# Patient Record
Sex: Female | Born: 1945 | Race: White | Hispanic: No | Marital: Married
Health system: Southern US, Academic
[De-identification: ages and names within clinical notes are randomized; demographics above are authoritative.]

## PROBLEM LIST (undated history)

## (undated) DIAGNOSIS — E039 Hypothyroidism, unspecified: Secondary | ICD-10-CM

## (undated) DIAGNOSIS — I351 Nonrheumatic aortic (valve) insufficiency: Secondary | ICD-10-CM

## (undated) DIAGNOSIS — E785 Hyperlipidemia, unspecified: Secondary | ICD-10-CM

## (undated) HISTORY — DX: Hypothyroidism, unspecified: E03.9

## (undated) HISTORY — DX: Hyperlipidemia, unspecified: E78.5

## (undated) HISTORY — DX: Nonrheumatic aortic (valve) insufficiency: I35.1

## (undated) HISTORY — PX: OTHER SURGICAL HISTORY: SHX169

---

## 2006-10-21 ENCOUNTER — Encounter: Admission: RE | Admit: 2006-10-21 | Discharge: 2006-10-21 | Payer: Self-pay | Admitting: Family Medicine

## 2007-03-29 ENCOUNTER — Ambulatory Visit: Payer: Self-pay | Admitting: Cardiology

## 2007-05-11 ENCOUNTER — Ambulatory Visit: Payer: Self-pay

## 2007-05-11 ENCOUNTER — Encounter: Payer: Self-pay | Admitting: Cardiology

## 2007-05-19 ENCOUNTER — Ambulatory Visit: Payer: Self-pay | Admitting: Cardiology

## 2007-05-19 ENCOUNTER — Encounter: Admission: RE | Admit: 2007-05-19 | Discharge: 2007-05-19 | Payer: Self-pay | Admitting: Neurosurgery

## 2007-12-22 ENCOUNTER — Encounter: Admission: RE | Admit: 2007-12-22 | Discharge: 2007-12-22 | Payer: Self-pay | Admitting: Neurosurgery

## 2008-01-04 ENCOUNTER — Encounter: Admission: RE | Admit: 2008-01-04 | Discharge: 2008-01-04 | Payer: Self-pay | Admitting: Neurosurgery

## 2008-05-03 ENCOUNTER — Encounter: Payer: Self-pay | Admitting: Cardiology

## 2008-05-03 ENCOUNTER — Ambulatory Visit: Payer: Self-pay

## 2008-06-28 ENCOUNTER — Ambulatory Visit: Payer: Self-pay | Admitting: Cardiology

## 2009-08-18 ENCOUNTER — Encounter: Payer: Self-pay | Admitting: Cardiology

## 2010-01-06 ENCOUNTER — Ambulatory Visit: Payer: Self-pay | Admitting: Cardiology

## 2010-01-06 DIAGNOSIS — I359 Nonrheumatic aortic valve disorder, unspecified: Secondary | ICD-10-CM | POA: Insufficient documentation

## 2010-01-06 DIAGNOSIS — I10 Essential (primary) hypertension: Secondary | ICD-10-CM | POA: Insufficient documentation

## 2010-02-04 ENCOUNTER — Ambulatory Visit: Payer: Self-pay

## 2010-02-04 ENCOUNTER — Ambulatory Visit: Payer: Self-pay | Admitting: Cardiovascular Disease

## 2010-02-04 ENCOUNTER — Ambulatory Visit (HOSPITAL_COMMUNITY): Admission: RE | Admit: 2010-02-04 | Discharge: 2010-02-04 | Payer: Self-pay | Admitting: Cardiology

## 2010-02-04 ENCOUNTER — Encounter: Payer: Self-pay | Admitting: Cardiology

## 2010-02-25 ENCOUNTER — Encounter: Payer: Self-pay | Admitting: Cardiology

## 2010-03-10 ENCOUNTER — Ambulatory Visit: Payer: Self-pay | Admitting: Cardiology

## 2010-03-10 DIAGNOSIS — I080 Rheumatic disorders of both mitral and aortic valves: Secondary | ICD-10-CM | POA: Insufficient documentation

## 2010-05-17 ENCOUNTER — Encounter: Payer: Self-pay | Admitting: Neurosurgery

## 2010-05-28 NOTE — Assessment & Plan Note (Signed)
Summary: 2 month rov.sl   Visit Type:  Follow-up Primary Provider:  Hamrick  CC:  No complaints.  History of Present Illness: Patient seen in follow up.  Doing fairly well.  Echo results reviewed in detail.  She is asymptomatic, but uses a cane.  On disability related to a work related injury. Has disc disease.   Denies chest pain, or significant shortness of breath.  BP has been well controlled.  Prefers to defer echo studies for as long as possible    Current Medications (verified): 1)  Metoprolol Tartrate 50 Mg Tabs (Metoprolol Tartrate) .... Take One Tablet Two Times A Day 2)  Levothyroxine Sodium 50 Mcg Tabs (Levothyroxine Sodium) .... Take 1 Tablet By Mouth Once A Day 3)  Flexeril 10 Mg Tabs (Cyclobenzaprine Hcl) .... Take 1 Tablet By Mouth Once A Day 4)  Oxycodone-Acetaminophen 5-325 Mg Tabs (Oxycodone-Acetaminophen) .... As Needed 5)  Fish Oil 1000 Mg Caps (Omega-3 Fatty Acids) .... Take 1 Capsule By Mouth Two Times A Day 6)  Cholest Off 450 Mg Tabs (Plant Sterols and Stanols) .... Take 1 Tablet By Mouth Once A Day 7)  Natural Vegetable Laxative  Tabs (Senna-Fennel) .... Take 1 Tablet By Mouth Two Times A Day 8)  Rhinocort Aqua 32 Mcg/act Susp (Budesonide) .... As Needed 9)  Amitriptyline Hcl 50 Mg Tabs (Amitriptyline Hcl) .... Take 1 Tab By Mouth At Bedtime 10)  Promethazine Hcl 25 Mg Tabs (Promethazine Hcl) .... For Nausea  Allergies: 1)  ! Penicillin 2)  ! Talwin  Vital Signs:  Patient profile:   65 year old female Height:      65 inches Weight:      133 pounds BMI:     22.21 Pulse rate:   60 / minute Pulse rhythm:   regular Resp:     18 per minute BP sitting:   110 / 60  (right arm) Cuff size:   regular  Vitals Entered By: Vikki Ports (March 10, 2010 9:18 AM)  Physical Exam  General:  Well developed, well nourished, in no acute distress. Head:  normocephalic and atraumatic Eyes:  PERRLA/EOM intact; conjunctiva and lids normal. Lungs:  Clear bilaterally  to auscultation and percussion. Heart:  Normal S1 and S2.  Ocassional extrasystoles.  Apical murmur 2/6, early diastolic blow 1-2/6 Pulses:  pulses normal in all 4 extremities Extremities:  No clubbing or cyanosis. No edema Neurologic:  Alert and oriented x 3.   Echocardiogram  Procedure date:  02/04/2010  Findings:      Study Conclusions            - Left ventricle: The cavity size was mildly dilated. The estimated       ejection fraction was 55%.     - Aortic valve: Moderate regurgitation.     - Aorta: The aorta was moderately dilated.     - Atrial septum: No defect or patent foramen ovale was identified.  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  Her Lv function is preserved, and she has mild/mod AI and MR.  Echo reviewed per report of Dr. Eden Emms.  I suggested to her that we should follow her annually, but she wants to push that out farther.  We discussed the difficulty with more than one lesion of predictability of long term outcome.  Cannot do exercise test.  BP is well controlled, and she has cut back on medication.  Aortic root dimensions noted and will keep in mind, and watch over long term.    Problem # 2:  HYPERTENSION, BENIGN (ICD-401.1) currently under control at present time. Her updated medication list for this problem includes:    Metoprolol Tartrate 50 Mg Tabs (Metoprolol tartrate) .Marland Kitchen... Take one tablet two times a day  Patient Instructions: 1)  Your physician recommends that you schedule a follow-up appointment in: 18 months

## 2010-05-28 NOTE — Assessment & Plan Note (Signed)
Summary: 18 month ROV   Visit Type:  Follow-up  CC:  No complains.  History of Present Illness: Patient has lost alot of weight.  She feels better, and looks great.  Not walking enough to tell if she would get short of breath.  Denies any chest pain.    Current Medications (verified): 1)  Metoprolol Tartrate 50 Mg Tabs (Metoprolol Tartrate) .... Take 1 1/2  Tablet By Mouth Twice A Day 2)  Levothyroxine Sodium 50 Mcg Tabs (Levothyroxine Sodium) .... Take 1 Tablet By Mouth Once A Day 3)  Flexeril 10 Mg Tabs (Cyclobenzaprine Hcl) .... Take 1 Tablet By Mouth Once A Day 4)  Oxycodone-Acetaminophen 5-325 Mg Tabs (Oxycodone-Acetaminophen) .... As Needed 5)  Fish Oil 1000 Mg Caps (Omega-3 Fatty Acids) .... Take 1 Capsule By Mouth Two Times A Day 6)  Cholest Off 450 Mg Tabs (Plant Sterols and Stanols) .... Take 1 Tablet By Mouth Once A Day 7)  Natural Vegetable Laxative  Tabs (Senna-Fennel) .... Take 1 Tablet By Mouth Two Times A Day 8)  Rhinocort Aqua 32 Mcg/act Susp (Budesonide) .... As Needed 9)  Amitriptyline Hcl 50 Mg Tabs (Amitriptyline Hcl) .... Take 1 Tab By Mouth At Bedtime 10)  Promethazine Hcl 25 Mg Tabs (Promethazine Hcl) .... For Nausea  Allergies (verified): 1)  ! Penicillin 2)  ! Talwin  Past History:  Past Medical History: Last updated: 06-25-08 Aortic Insufficiency Hyperlipidemia Hypothyroidism  Past Surgical History: Last updated: 25-Jun-2008 Choecsytectomy Hysterectomy Ruptured Cervical Disk  Family History: Last updated: June 25, 2008 Mother   died age 32 Cancer Father   d6ied age 78 fire/stroke Sisters 8    6 alive 2 deceased bypass/stents  Social History: Last updated: 06-25-08 Disabled due to back 40 pack year history quit 1990  Vital Signs:  Patient profile:   65 year old female Height:      65 inches Weight:      131.50 pounds BMI:     21.96 Pulse rate:   80 / minute Pulse rhythm:   irregular Resp:     18 per minute BP sitting:   126 / 70   (left arm) Cuff size:   regular  Vitals Entered By: Vikki Ports (January 06, 2010 1:08 PM)  Physical Exam  General:  Well developed, well nourished, in no acute distress. Head:  normocephalic and atraumatic Eyes:  PERRLA/EOM intact; conjunctiva and lids normal. Lungs:  Clear bilaterally to auscultation and percussion. Heart:  PMI non displaced.  Normal S1 and S2.  Early diastolic blow 2/6.  No rumble. Abdomen:  Bowel sounds positive; abdomen soft and non-tender without masses, organomegaly, or hernias noted. No hepatosplenomegaly. Pulses:  pulses normal in all 4 extremities Extremities:  No clubbing or cyanosis. Neurologic:  Alert and oriented x 3.   EKG  Procedure date:  01/06/2010  Findings:      NSR.  Occasional PVBs.  Nonspecific ST abnormality.   Impression & Recommendations:  Problem # 1:  AORTIC STENOSIS/ INSUFFICIENCY, NON-RHEUMATIC (ICD-424.1) Has AI at present time.  Mild/moderate by exam.  Needs echo and CT scan to assess aorta.  Concerned about costs of these studies.   Her updated medication list for this problem includes:    Metoprolol Tartrate 50 Mg Tabs (Metoprolol tartrate) .Marland Kitchen... Take 1 1/2  tablet by mouth twice a day  Problem # 2:  HYPERTENSION, BENIGN (ICD-401.1) Controlled on current medications.  Her updated medication list for this problem includes:    Metoprolol Tartrate 50 Mg Tabs (Metoprolol tartrate) .Marland KitchenMarland KitchenMarland KitchenMarland Kitchen  Take 1 1/2  tablet by mouth twice a day  Orders: EKG w/ Interpretation (93000)  Other Orders: Echocardiogram (Echo)  Patient Instructions: 1)  Your physician recommends that you schedule a follow-up appointment in: 2 MONTHS 2)  Your physician recommends that you continue on your current medications as directed. Please refer to the Current Medication list given to you today. 3)  Your physician has requested that you have an echocardiogram.  Echocardiography is a painless test that uses sound waves to create images of your heart. It  provides your doctor with information about the size and shape of your heart and how well your heart's chambers and valves are working.  This procedure takes approximately one hour. There are no restrictions for this procedure.

## 2010-09-08 NOTE — Assessment & Plan Note (Signed)
[facility]                            CARDIOLOGY OFFICE NOTE   Theresa Weber, Theresa Weber                     MRN:          [id number]  DATE:06/28/2008                            DOB:          January 04, 1946    Theresa Weber is in for followup visit.  To briefly summarize, she has been  relatively stable from a cardiac standpoint.  She had a problem with  removal of a glass from her left foot.  This is now healing, but it is  required quite a bit of treatment.  She also has a bad toe on the right,  and this was required some therapy as well.  The patient is a nonsmoker.  She has hypothyroidism and had laboratory studies done in November.  She  also has an elevated white count, this has been followed by Dr. Nathanial Rancher.  She had an MRI of her back, and she has had steroid injections  apparently.  She is currently on metoprolol on b.i.d. basis.   MEDICATIONS:  1. Levothyroxine 50 mcg daily.  2. Fish oil daily.  3. Cholesterol vegetable laxative b.i.d.  4. Flexeril 10 mg two daily.  5. Metoprolol 50 mg one and half tablets b.i.d.   On physical, the blood pressure is 140/70 and the pulse is 57.  She does  not have obviously bounding pulses.  She does have a minimal systolic  ejection murmur and early diastolic blow compatible with moderate aortic  regurgitation.  There is no extremity edema.   The echocardiogram demonstrates overall normal left ventricular size and  function.  There is mild aortic root dilatation, but the actual aortic  size is measured at 36 mm.  There is moderate AR noted.   The electrocardiogram demonstrates normal sinus bradycardia, otherwise  unremarkable.   IMPRESSION:  1. Hypertension.  2. Moderate aortic regurgitation.  3. Mild/moderate aortic regurgitation.  4. Hypothyroidism.   RECOMMENDATIONS:  1. I had recommended the patient come back for annual followup with 2-      D study on an annual basis given her AI.  She would like to  defer      that to 18 months.  She probably will be stable      to do that, but I emphasized to her the need for continued      followup.  2. She will continue to follow up with Dr. Nathanial Rancher.     Arturo Morton. Riley Kill, MD, [facility]  Electronically Signed    TDS/MedQ  DD: 06/28/2008  DT: 06/29/2008  Job #: [id number]   cc:   Burnell Blanks, MD

## 2010-09-08 NOTE — Assessment & Plan Note (Signed)
[facility]                            CARDIOLOGY OFFICE NOTE   Theresa Weber, Theresa Weber                     MRN:          [id number]  DATE:05/19/2007                            DOB:          [birth year]    Theresa Weber is in for a follow-up visit.  In general, she has been  relatively stable.  She has not been having any current chest  discomfort. She does continue to have leg and some back discomfort.   CURRENT MEDICATIONS:  1. Metoprolol 50 mg b.i.d.  2. Levothyroxine 50 mcg daily.  3. Vegetable laxative.  4. Flexeril 2-3 daily.   RECENT LABORATORY STUDIES:  BUN of 24, creatinine 1.08.  White count was  11.9,  hemoglobin 12.8, TSH was normal at 2.53.   Most recent echocardiogram reveals normal left ventricular size and  function with an ejection fraction of 55%.  There is just mild aortic  regurgitation to moderate at most and renal artery imaging suggested  patent renal arteries bilaterally.   IMPRESSION:  1. Hypertension.  2. Mild aortic regurgitation.  3. Hypothyroidism.  4. No significant evidence of peripheral vascular disease.   PLAN:  Return to clinic in 6 months.     Arturo Morton. Riley Kill, MD, [facility]  Electronically Signed    TDS/MedQ  DD: 07/17/2007  DT: 07/17/2007  Job #: [id number]

## 2010-09-08 NOTE — Assessment & Plan Note (Signed)
[facility]ALTHC[facility]                            CARDIOLOGY OFFICE NOTE   IKHLAS, ALBO                     MRN:          [id number]  DATE:03/29/2007                            DOB:          Sep 03, 1945    Ms. Dearing is in for followup.  She is not really feeling very well.  A  number of things have occurred since I last saw her.  She was uninsured  and felt guilty about coming back.  In June, she had sudden pain in her  right leg.  She had an MRI and apparently was referred to Dr. Franky Macho  who looked at the MRI.  Surgery was considered.  The left leg hurts more  than the right.  She wondered about peripheral vascular disease and  asked about this today.  She does not smoke.  Apparently, because of  financial issues, she did not go ahead and have the surgery, although  her legs continue to hurt.   MEDICATIONS:  1. Metoprolol 50 mg b.i.d.  2. Levothyroxine 50 mcg daily.  3. Fish oil.  4. Cholesterol.  5. Vegetable laxative.   EXAMINATION:  Blood pressure 163/90, pulse 78.  There are no femoral bruits and she has intact pulses.  She has  excellent popliteal pulses and pulses in the feet as well.   Review of her recent laboratory studies reveal a creatinine 1.3, BUN 29.  Liver functions were normal.  Hematocrit was 12.5.  A TSH was mildly  elevated at 5.67.   Her EKG today reveals normal sinus rhythm and within normal limits.   We will need to review her data.  Her blood pressures are elevated  currently, and her BUN and creatinine are slightly increased.  The  etiology for the mild renal dysfunction is unclear.  She would benefit  from an abdominal ultrasound to identify the renal arteries.  This would  allow [place] to exclude renovascular hypertension for deterioration, and  also clear the way for perhaps being more aggressive with ACE  inhibition.  She also likely needs urinary studies to exclude  proteinuria.  The patient also has a history of  moderate aortic  regurgitation, although this is difficult to tell on examination today.  We will go ahead and repeat her 2D echocardiogram.     Arturo Morton. Riley Kill, MD, [facility]  Electronically Signed    TDS/MedQ  DD: 03/31/2007  DT: 03/31/2007  Job #: [id number]

## 2011-06-21 ENCOUNTER — Telehealth: Payer: Self-pay | Admitting: Cardiology

## 2011-06-21 DIAGNOSIS — I080 Rheumatic disorders of both mitral and aortic valves: Secondary | ICD-10-CM

## 2011-06-21 NOTE — Telephone Encounter (Signed)
New msg Pt said she thought she was supposed to have echo before her next appt please call her back

## 2011-06-21 NOTE — Telephone Encounter (Signed)
This pt should have an echo prior to appointment. Order for Echo placed in system.

## 2011-06-22 NOTE — Telephone Encounter (Signed)
Lela called the pt and scheduled her for an ECHO the same day.

## 2011-08-12 ENCOUNTER — Encounter: Payer: Self-pay | Admitting: *Deleted

## 2011-08-27 ENCOUNTER — Encounter: Payer: Self-pay | Admitting: Cardiology

## 2011-08-27 ENCOUNTER — Other Ambulatory Visit: Payer: Self-pay

## 2011-08-27 ENCOUNTER — Ambulatory Visit (INDEPENDENT_AMBULATORY_CARE_PROVIDER_SITE_OTHER): Payer: Medicare Other | Admitting: Cardiology

## 2011-08-27 ENCOUNTER — Ambulatory Visit: Payer: Self-pay | Admitting: Cardiology

## 2011-08-27 ENCOUNTER — Ambulatory Visit (HOSPITAL_COMMUNITY): Payer: Medicare Other | Attending: Internal Medicine

## 2011-08-27 VITALS — BP 122/74 | HR 58 | Ht 65.0 in | Wt 136.1 lb

## 2011-08-27 DIAGNOSIS — I359 Nonrheumatic aortic valve disorder, unspecified: Secondary | ICD-10-CM

## 2011-08-27 DIAGNOSIS — E785 Hyperlipidemia, unspecified: Secondary | ICD-10-CM | POA: Insufficient documentation

## 2011-08-27 DIAGNOSIS — I10 Essential (primary) hypertension: Secondary | ICD-10-CM

## 2011-08-27 DIAGNOSIS — I080 Rheumatic disorders of both mitral and aortic valves: Secondary | ICD-10-CM

## 2011-08-27 NOTE — Progress Notes (Signed)
   HPI:  Patient is in for follow up.  She is doing well.  She does not exercise well because of her feet.  She and I reviewed her echo images today.  Denies any chest pain.  No exertional symptoms.    Current Outpatient Prescriptions  Medication Sig Dispense Refill  . amitriptyline (ELAVIL) 50 MG tablet Take 75 mg by mouth at bedtime.       . budesonide (RHINOCORT AQUA) 32 MCG/ACT nasal spray Place 1 spray into the nose as needed.      . cyclobenzaprine (FLEXERIL) 10 MG tablet Take 10 mg by mouth daily.      . fish oil-omega-3 fatty acids 1000 MG capsule Take 2 g by mouth daily.      Marland Kitchen levothyroxine (SYNTHROID, LEVOTHROID) 50 MCG tablet Take 50 mcg by mouth daily.      Marland Kitchen METOPROLOL TARTRATE PO Take 50 mg by mouth 2 (two) times daily. One tablet in the am and 1/2 tablet in the pm      . Plant Sterols and Stanols (CHOLEST OFF) 450 MG TABS Take 450 mg by mouth daily.      . Senna (NATURAL VEGETABLE LAXATIVE PO) Take by mouth 3 (three) times daily.       . traMADol (ULTRAM) 50 MG tablet Take 50 mg by mouth daily.        Allergies  Allergen Reactions  . Penicillins   . Pentazocine Lactate     Past Medical History  Diagnosis Date  . Aortic insufficiency   . Hyperlipidemia   . Hypothyroidism     Past Surgical History  Procedure Date  . Choecsytectomy   . Hyterectomy   . Ruptured cervical disk     Family History  Problem Relation Age of Onset  . Cancer Mother 52  . Stroke Father 81    History   Social History  . Marital Status: Married    Spouse Name: N/A    Number of Children: N/A  . Years of Education: N/A   Occupational History  . disabled    Social History Main Topics  . Smoking status: Former Smoker    Quit date: 04/26/1988  . Smokeless tobacco: Not on file  . Alcohol Use: Not on file  . Drug Use: Not on file  . Sexually Active: Not on file   Other Topics Concern  . Not on file   Social History Narrative  . No narrative on file    ROS: Please see the  HPI.  All other systems reviewed and negative.  PHYSICAL EXAM:  BP 122/74  Pulse 58  Ht 5\' 5"  (1.651 m)  Wt 136 lb 1.9 oz (61.744 kg)  BMI 22.65 kg/m2  General: Well developed, well nourished, in no acute distress. Head:  Normocephalic and atraumatic. Neck: no JVD Lungs: Clear to auscultation and percussion. Heart: Normal S1 and S2. SEM 2/6. 1/6 early diastolic blow.   Abdomen:  Normal bowel sounds; soft; non tender; no organomegaly Pulses: Pulses normal in all 4 extremities. Extremities: No clubbing or cyanosis. No edema. Neurologic: Alert and oriented x 3.  EKG:  SB.  First degree av block.    ASSESSMENT AND PLAN:

## 2011-08-27 NOTE — Patient Instructions (Signed)
Your physician wants you to follow-up in: February/March 2014.  You will receive a reminder letter in the mail two months in advance. If you don't receive a letter, please call our office to schedule the follow-up appointment.  Your physician recommends that you continue on your current medications as directed. Please refer to the Current Medication list given to you today.  

## 2011-08-27 NOTE — Assessment & Plan Note (Signed)
Exam not much changed.   May need CT for aortic root assesment.  Continue close follow up.

## 2011-08-27 NOTE — Assessment & Plan Note (Signed)
Nicely controlled.   

## 2011-09-17 ENCOUNTER — Other Ambulatory Visit: Payer: Self-pay | Admitting: Family Medicine

## 2011-09-17 DIAGNOSIS — M25519 Pain in unspecified shoulder: Secondary | ICD-10-CM

## 2011-09-18 ENCOUNTER — Ambulatory Visit
Admission: RE | Admit: 2011-09-18 | Discharge: 2011-09-18 | Disposition: A | Discharge status: Home / Self Care | Payer: Medicare Other | Source: Ambulatory Visit | Attending: Family Medicine | Admitting: Family Medicine

## 2011-09-18 DIAGNOSIS — M25519 Pain in unspecified shoulder: Secondary | ICD-10-CM

## 2011-09-27 ENCOUNTER — Telehealth: Payer: Self-pay | Admitting: Cardiology

## 2011-09-27 DIAGNOSIS — I10 Essential (primary) hypertension: Secondary | ICD-10-CM

## 2011-09-27 DIAGNOSIS — Z01810 Encounter for preprocedural cardiovascular examination: Secondary | ICD-10-CM

## 2011-09-27 NOTE — Telephone Encounter (Signed)
Spoke with pt. Pt scheduled for rotator cuff repair 10/08/11 by Dr Drema Dallas. Pt is asking for cardiac clearance prior to surgery. I will send to Dr Riley Kill for review and recommendations.

## 2011-09-27 NOTE — Telephone Encounter (Signed)
New problem:  Patient calling .Need a surgical clearance for two tears in shoulder & rotate cuff, need to be repair.

## 2011-09-28 NOTE — Telephone Encounter (Signed)
This pt was last seen by Dr Riley Kill on 08/27/11.  I will forward this message to Dr Riley Kill to determine if the pt can be cleared for surgery based on her recent office visit.

## 2011-10-04 ENCOUNTER — Telehealth: Payer: Self-pay | Admitting: Cardiology

## 2011-10-04 NOTE — Telephone Encounter (Signed)
Pt calling back again regarding surgical clearance. Pt is scheduled this Friday for rotator cuff repair with Dr. Carin Primrose in [place]. She has recently seen Dr. Riley Kill and needs surgical clearance.  Dr. Carin Primrose office Fax# is [phone]   She would like a call back when this is sent.  Mylo Red RN

## 2011-10-04 NOTE — Telephone Encounter (Signed)
Pt aware Dr. Riley Kill will review and receive call back tomorrow. Pt is agreeable with plan. She is aware Dr. Riley Kill is in clinic today and will review today. Mylo Red RN

## 2011-10-04 NOTE — Telephone Encounter (Signed)
Follow-up:    Patient called wishing to speak with you again.  Please call back.

## 2011-10-04 NOTE — Telephone Encounter (Signed)
New msg Pt had some questions about surgery she has scheduled

## 2011-10-05 NOTE — Telephone Encounter (Signed)
I spoke and talked with the patient in detail.  I reviewed her data for approximately one hour.  Her MRI is listed in EMR.  She needs shoulder surgery which is planned in [place] on Friday.   The surgery that is planned is an intermediate risk surgery per the [facility] guidelines.  She also has two clinical risk factors  (HTN and valvular heart disease), and she also is not able to exercise because of her feet.  As such, she cannot be assessed as her level of activity does not exceed the four met level.  ACC guidelines suggest that she has low exercise tolerance, intermediate surgical risk with one to two clinical risk factors, and the decision for cardiac imaging depends on weather it would change treatment.  She is not able to exercise on standard stress.    It would should it be high risk, as she is scheduled in a non PCI facility.    As such, I have recommended that she have a pharmacologic myoview study.  We will try to arrange this for Wednesday or Thursday.  If negative, she would be cleared for surgery.

## 2011-10-05 NOTE — Telephone Encounter (Signed)
Spoke with pt, myoview scheduled for tomorrow at 12 non for surgical clearance. Instructions discussed with pt over the phone. Pre-cert made aware.

## 2011-10-06 ENCOUNTER — Ambulatory Visit (HOSPITAL_COMMUNITY): Payer: Medicare Other | Attending: Cardiovascular Disease | Admitting: Radiology

## 2011-10-06 VITALS — BP 140/81 | Ht 65.0 in | Wt 130.0 lb

## 2011-10-06 DIAGNOSIS — R0602 Shortness of breath: Secondary | ICD-10-CM

## 2011-10-06 DIAGNOSIS — Z01810 Encounter for preprocedural cardiovascular examination: Secondary | ICD-10-CM | POA: Insufficient documentation

## 2011-10-06 DIAGNOSIS — I10 Essential (primary) hypertension: Secondary | ICD-10-CM | POA: Insufficient documentation

## 2011-10-06 MED ORDER — TECHNETIUM TC 99M TETROFOSMIN IV KIT
11.0000 | PACK | Freq: Once | INTRAVENOUS | Status: AC | PRN
  Administered 2011-10-06: 11 via INTRAVENOUS

## 2011-10-06 MED ORDER — REGADENOSON 0.4 MG/5ML IV SOLN
0.4000 mg | Freq: Once | INTRAVENOUS | Status: AC
  Administered 2011-10-06: 0.4 mg via INTRAVENOUS

## 2011-10-06 MED ORDER — TECHNETIUM TC 99M TETROFOSMIN IV KIT
33.0000 | PACK | Freq: Once | INTRAVENOUS | Status: AC | PRN
  Administered 2011-10-06: 33 via INTRAVENOUS

## 2011-10-06 NOTE — Progress Notes (Signed)
[facility] SITE 3 NUCLEAR MED [address] [place] [address] [phone]  Cardiology Nuclear Med Study  Theresa Weber is a 66 y.o. female     MRN : [id number]     DOB: 02-08-1946  Procedure Date: 10/06/2011  Nuclear Med Background Indication for Stress Test:  Evaluation for Ischemia and Surgical Clearance- Pre- op R Rotator Cuff Repair- Dr. Carin Primrose (10/08/11) History:  5/13 Echo- EF 55-60%, moderate AR, mild MR Cardiac Risk Factors: History of Smoking, Hypertension and Lipids  Symptoms:  DOE and Light-Headedness   Nuclear Pre-Procedure Caffeine/Decaff Intake:  None NPO After: 7:00am   Lungs:  clear O2 Sat: 98% on room air. IV 0.9% NS with Angio Cath:  22g  IV Site: L Antecubital  IV Started by:  Bonnita Levan, RN  Chest Size (in):  34 Cup Size: C  Height: 5\' 5"  (1.651 m)  Weight:  130 lb (58.968 kg)  BMI:  Body mass index is 21.63 kg/(m^2). Tech Comments:  Patient held Metoprolol this AM    Nuclear Med Study 1 or 2 day study: 1 day  Stress Test Type:  Lexiscan  Reading MD: Charlton Haws, MD  Order Authorizing Provider:  Shawnie Pons, MD  Resting Radionuclide: Technetium 72m Tetrofosmin  Resting Radionuclide Dose: 11.0 mCi   Stress Radionuclide:  Technetium 33m Tetrofosmin  Stress Radionuclide Dose: 33.0 mCi           Stress Protocol Rest HR: 68 Stress HR: 96  Rest BP: 140/81 Stress BP: 134/89  Exercise Time (min): n/a METS: n/a   Predicted Max HR: 156 bpm % Max HR: 61.54 bpm Rate Pressure Product: 46962   Dose of Adenosine (mg):  n/a Dose of Lexiscan: 0.4 mg  Dose of Atropine (mg): n/a Dose of Dobutamine: n/a mcg/kg/min (at max HR)  Stress Test Technologist: Bonnita Levan, RN  Nuclear Technologist:  Domenic Polite, CNMT     Rest Procedure:  Myocardial perfusion imaging was performed at rest 45 minutes following the intravenous administration of Technetium 70m Tetrofosmin. Rest ECG: No acute changes  Stress Procedure:  The patient received  IV Lexiscan 0.4 mg over 15-seconds.  Technetium 48m Tetrofosmin injected at 30-seconds.  There were no significant changes with Lexiscan. Occ. PVC's noted. Quantitative spect images were obtained after a 45 minute delay. Stress ECG: No significant change from baseline ECG  QPS Raw Data Images:  Patient motion noted. Stress Images:  There is decreased uptake in the anterior wall. Rest Images:  Normal homogeneous uptake in all areas of the myocardium. Subtraction (SDS):  These findings are consistent with ischemia. Transient Ischemic Dilatation (Normal <1.22):  1.00 Lung/Heart Ratio (Normal <0.45):  0.17  Quantitative Gated Spect Images QGS EDV:  92 ml QGS ESV:  34 ml  Impression Exercise Capacity:  Lexiscan with no exercise. BP Response:  Normal blood pressure response. Clinical Symptoms:  Nausea ECG Impression:  No significant ST segment change suggestive of ischemia. Comparison with Prior Nuclear Study: No images to compare  Overall Impression:  Small area of mild anteroapical ischemia.  SDS 5  LV Ejection Fraction: 63% LV Wall Motion:  EF calculates as normal but visually anteroapex appears hypokinetic   Charlton Haws

## 2011-10-07 ENCOUNTER — Telehealth: Payer: Self-pay | Admitting: Cardiology

## 2011-10-07 NOTE — Telephone Encounter (Signed)
Pt also needs surgical clearance for rotater cuff repair with dr Tinnie Gens yaste, pls fax asap before 3p has to fax paper work to surgical center and surgery is tomorrow and they have to have it before 3p, fax [phone] att Lora Havens ,office [phone]

## 2011-10-07 NOTE — Telephone Encounter (Signed)
New msg Pt wants to talk to you about test results. She wants results faxed to [phone]

## 2011-10-07 NOTE — Telephone Encounter (Signed)
Left message for kasey at dr Velda Shell office. Aware surgery will need to be cx due to abnormal stress test and pt needing to be seen for clearance. Aware have not gotten in touch with the pt yet.

## 2011-10-07 NOTE — Telephone Encounter (Signed)
Spoke with pt, she is aware of abnormal nuclear results. Aware I have spoken to dr yates office and cx her surgery. She will see dr Riley Kill on the 19th to discuss.

## 2011-10-07 NOTE — Telephone Encounter (Signed)
Pt rtn call to debra, pls call [phone]

## 2011-10-07 NOTE — Telephone Encounter (Signed)
Will route to Deliah Goody, RN since she knows all about [place].

## 2011-10-13 ENCOUNTER — Ambulatory Visit (INDEPENDENT_AMBULATORY_CARE_PROVIDER_SITE_OTHER): Payer: Medicare Other | Admitting: Cardiology

## 2011-10-13 ENCOUNTER — Encounter: Payer: Self-pay | Admitting: Cardiology

## 2011-10-13 VITALS — BP 128/70 | HR 70 | Ht 64.0 in | Wt 133.0 lb

## 2011-10-13 DIAGNOSIS — R9430 Abnormal result of cardiovascular function study, unspecified: Secondary | ICD-10-CM

## 2011-10-13 DIAGNOSIS — R9439 Abnormal result of other cardiovascular function study: Secondary | ICD-10-CM

## 2011-10-13 DIAGNOSIS — R931 Abnormal findings on diagnostic imaging of heart and coronary circulation: Secondary | ICD-10-CM

## 2011-10-13 DIAGNOSIS — I359 Nonrheumatic aortic valve disorder, unspecified: Secondary | ICD-10-CM

## 2011-10-13 DIAGNOSIS — R079 Chest pain, unspecified: Secondary | ICD-10-CM

## 2011-10-13 DIAGNOSIS — I10 Essential (primary) hypertension: Secondary | ICD-10-CM

## 2011-10-13 LAB — BASIC METABOLIC PANEL
BUN: 20 mg/dL (ref 6–23)
CO2: 23 mEq/L (ref 19–32)
Chloride: 106 mEq/L (ref 96–112)
Glucose, Bld: 92 mg/dL (ref 70–99)
Potassium: 4.2 mEq/L (ref 3.5–5.1)
Sodium: 136 mEq/L (ref 135–145)

## 2011-10-13 LAB — CBC WITH DIFFERENTIAL/PLATELET
Basophils Absolute: 0 10*3/uL (ref 0.0–0.1)
Eosinophils Absolute: 0.2 10*3/uL (ref 0.0–0.7)
HCT: 38.3 % (ref 36.0–46.0)
Hemoglobin: 12.7 g/dL (ref 12.0–15.0)
Lymphs Abs: 4.1 10*3/uL — ABNORMAL HIGH (ref 0.7–4.0)
MCHC: 33 g/dL (ref 30.0–36.0)
MCV: 97.6 fl (ref 78.0–100.0)
Monocytes Absolute: 0.6 10*3/uL (ref 0.1–1.0)
Monocytes Relative: 4.7 % (ref 3.0–12.0)
Neutro Abs: 7.8 10*3/uL — ABNORMAL HIGH (ref 1.4–7.7)
RDW: 13.2 % (ref 11.5–14.6)

## 2011-10-13 NOTE — Patient Instructions (Addendum)
Your physician has requested that you have a cardiac catheterization. Cardiac catheterization is used to diagnose and/or treat various heart conditions. Doctors may recommend this procedure for a number of different reasons. The most common reason is to evaluate chest pain. Chest pain can be a symptom of coronary artery disease (CAD), and cardiac catheterization can show whether plaque is narrowing or blocking your heart's arteries. This procedure is also used to evaluate the valves, as well as measure the blood flow and oxygen levels in different parts of your heart. For further information please visit www.[email or web address]. Please follow instruction sheet, as given.   Your physician recommends that you continue on your current medications as directed. Please refer to the Current Medication list given to you today.  

## 2011-10-14 ENCOUNTER — Encounter (HOSPITAL_COMMUNITY): Payer: Self-pay | Admitting: Pharmacy Technician

## 2011-10-19 ENCOUNTER — Encounter (HOSPITAL_COMMUNITY): Payer: Self-pay | Admitting: Cardiology

## 2011-10-19 ENCOUNTER — Telehealth: Payer: Self-pay | Admitting: Cardiology

## 2011-10-19 ENCOUNTER — Encounter (HOSPITAL_COMMUNITY)
Admission: RE | Disposition: A | Discharge status: Home / Self Care | Payer: Self-pay | Source: Ambulatory Visit | Attending: Cardiology

## 2011-10-19 ENCOUNTER — Ambulatory Visit (HOSPITAL_COMMUNITY)
Admission: RE | Admit: 2011-10-19 | Discharge: 2011-10-19 | Disposition: A | Discharge status: Home / Self Care | Payer: Medicare Other | Source: Ambulatory Visit | Attending: Cardiology | Admitting: Cardiology

## 2011-10-19 DIAGNOSIS — I359 Nonrheumatic aortic valve disorder, unspecified: Secondary | ICD-10-CM

## 2011-10-19 DIAGNOSIS — R9430 Abnormal result of cardiovascular function study, unspecified: Secondary | ICD-10-CM | POA: Insufficient documentation

## 2011-10-19 DIAGNOSIS — R931 Abnormal findings on diagnostic imaging of heart and coronary circulation: Secondary | ICD-10-CM | POA: Insufficient documentation

## 2011-10-19 DIAGNOSIS — I2510 Atherosclerotic heart disease of native coronary artery without angina pectoris: Secondary | ICD-10-CM

## 2011-10-19 HISTORY — PX: LEFT HEART CATHETERIZATION WITH CORONARY ANGIOGRAM: SHX5451

## 2011-10-19 SURGERY — LEFT HEART CATHETERIZATION WITH CORONARY ANGIOGRAM
Anesthesia: LOCAL

## 2011-10-19 MED ORDER — ONDANSETRON HCL 4 MG/2ML IJ SOLN: 4.0000 mg | Freq: Four times a day (QID) | INTRAMUSCULAR | Status: DC | PRN

## 2011-10-19 MED ORDER — ASPIRIN 81 MG PO CHEW
324.0000 mg | CHEWABLE_TABLET | ORAL | Status: AC
  Administered 2011-10-19: 324 mg via ORAL

## 2011-10-19 MED ORDER — ASPIRIN 81 MG PO CHEW
CHEWABLE_TABLET | ORAL | Status: AC
  Administered 2011-10-19: 324 mg via ORAL
  Filled 2011-10-19: qty 4

## 2011-10-19 MED ORDER — MIDAZOLAM HCL 2 MG/2ML IJ SOLN
INTRAMUSCULAR | Status: AC
  Filled 2011-10-19: qty 2

## 2011-10-19 MED ORDER — SODIUM CHLORIDE 0.9 % IJ SOLN: 3.0000 mL | Freq: Two times a day (BID) | INTRAMUSCULAR | Status: DC

## 2011-10-19 MED ORDER — SODIUM CHLORIDE 0.9 % IJ SOLN: 3.0000 mL | INTRAMUSCULAR | Status: DC | PRN

## 2011-10-19 MED ORDER — ONDANSETRON HCL 4 MG/2ML IJ SOLN
INTRAMUSCULAR | Status: AC
  Filled 2011-10-19: qty 2

## 2011-10-19 MED ORDER — ACETAMINOPHEN 325 MG PO TABS: 650.0000 mg | ORAL_TABLET | ORAL | Status: DC | PRN

## 2011-10-19 MED ORDER — LIDOCAINE HCL (PF) 1 % IJ SOLN
INTRAMUSCULAR | Status: AC
  Filled 2011-10-19: qty 30

## 2011-10-19 MED ORDER — SODIUM CHLORIDE 0.9 % IV SOLN: INTRAVENOUS | Status: DC

## 2011-10-19 MED ORDER — HEPARIN (PORCINE) IN NACL 2-0.9 UNIT/ML-% IJ SOLN
INTRAMUSCULAR | Status: AC
  Filled 2011-10-19: qty 2000

## 2011-10-19 MED ORDER — SODIUM CHLORIDE 0.9 % IV SOLN
INTRAVENOUS | Status: DC
  Administered 2011-10-19: 1000 mL via INTRAVENOUS

## 2011-10-19 MED ORDER — LIDOCAINE-EPINEPHRINE 1 %-1:100000 IJ SOLN
INTRAMUSCULAR | Status: AC
  Filled 2011-10-19: qty 1

## 2011-10-19 MED ORDER — NITROGLYCERIN 0.2 MG/ML ON CALL CATH LAB
INTRAVENOUS | Status: AC
  Filled 2011-10-19: qty 1

## 2011-10-19 MED ORDER — SODIUM CHLORIDE 0.9 % IV SOLN: 250.0000 mL | INTRAVENOUS | Status: DC | PRN

## 2011-10-19 NOTE — CV Procedure (Signed)
   Cardiac Catheterization Procedure Note  Name: Theresa Weber MRN: [id number] DOB: March 22, 1946  Procedure: Left Heart Cath, Selective Coronary Angiography, LV angiography, aortic root aortography  Indication: Preop abnormal CV test   Procedural details: The right groin was prepped, draped, and anesthetized with 1% lidocaine.  We were able to access the RFA, but unable to navigate the wire due to heavily calcified tortuosity in the aortoliliac junction which was calcified.  The LFA was a straighter shot.   Using modified Seldinger technique, a 5 French sheath was introduced into the left femoral artery. Standard Judkins catheters were used for coronary angiography and left ventriculography.  Aortic root was also done.   Catheter exchanges were performed over a guidewire. There were no immediate procedural complications. The patient was transferred to the post catheterization recovery area for further monitoring.  Procedural Findings: Hemodynamics:  AO 127/68 (90) LV 139/25 No gradient   Coronary angiography: Coronary dominance: right  All three coronaries are moderately calcified, as is the descending aorta and distal aorta.    Left mainstem: 20% ostial calcification  Left anterior descending (LAD): The LAD is calcified.  There is a mid stenosis of 560-70% that is relatively smooth.  The RAO views appear slightly more severe.  There is distal irregularity.    Left circumflex (LCx): Provides one very large marginal branch with mild irregularity, calcification, and no high grade obstruction.   Right coronary artery (RCA): The vessel is heavily calcified in the proximal mid junction.  There is 30% mid narrowing that is segmental  (lumpy bumpy) but not obstruction.  The PLA and PDA are large, with minimal irregularity, and no high grade obstruction.    Left ventriculography: Left ventricular systolic function is normal, LVEF is estimated at 55-65%, there is trace to mild mitral  regurgitation   The aortic root is enlarged mildly.  There is moderate aortic regurgitation.    Final Conclusions:    1.  Asymptomatic moderately high grade mid LAD lesion 2.  Heavily calcified aorto and coronary arteries 3.  Moderate aortic regurgitation as expected.    Recommendations:  1.  Follow up in office next week. 2.  Probably move surgery to cardiac facility. 3.  Aggressive prevention therapy with statins, ASA, and beta blockade--initial medical therapy.    Shawnie Pons 10/19/2011, 8:55 AM

## 2011-10-19 NOTE — Telephone Encounter (Signed)
Pt having rotator cuff surgery and they want to make sure she is cleared and they want to confirm the medication she needs to be on after surgery and she needs a call today

## 2011-10-19 NOTE — Addendum Note (Signed)
Addended by: Shawnie Pons D on: 10/19/2011 06:29 AM   Modules accepted: Orders

## 2011-10-19 NOTE — Assessment & Plan Note (Signed)
See echo study.  Will add aortic root to the study.

## 2011-10-19 NOTE — Progress Notes (Signed)
 HPI:  The patient returns in follow up.  She is stable.  She needs surgery, and it is scheduled for [place].  There is an intermediate risk type surgery, and she is limited by inability to do much activity.  We have had a thorough discussion about this.  Based on these factors, we elected to perform outpatient nuclear imaging as the results would likely change the planned approach to her care.  The study suggest ischemia as noted.  See below.    Raw Data Images: Patient motion noted.  Stress Images: There is decreased uptake in the anterior wall.  Rest Images: Normal homogeneous uptake in all areas of the myocardium.  Subtraction (SDS): These findings are consistent with ischemia.  Transient Ischemic Dilatation (Normal <1.22): 1.00  Lung/Heart Ratio (Normal <0.45): 0.17  Quantitative Gated Spect Images  QGS EDV: 92 ml  QGS ESV: 34 ml  Impression  Exercise Capacity: Lexiscan with no exercise.  BP Response: Normal blood pressure response.  Clinical Symptoms: Nausea  ECG Impression: No significant ST segment change suggestive of ischemia.  Comparison with Prior Nuclear Study: No images to compare  Overall Impression: Small area of mild anteroapical ischemia. SDS 5  LV Ejection Fraction: 63% LV Wall Motion: EF calculates as normal but visually anteroapex appears hypokinetic  Theresa Weber   The study, per Dr. Nishan, suggested not only ischemia and there was a suggestive of anterior hypokinesis as well.  As such, the patient and I have discussed all possible options.  We have suggested cath as approval for surgery requires permission from us to proceed.    No current outpatient prescriptions on file.    Allergies  Allergen Reactions  . Penicillins Other (See Comments)    unknown  . Pentazocine Lactate Other (See Comments)    Unknown    Past Medical History  Diagnosis Date  . Aortic insufficiency   . Hyperlipidemia   . Hypothyroidism     Past Surgical History  Procedure Date   . Choecsytectomy   . Hyterectomy   . Ruptured cervical disk     Family History  Problem Relation Age of Onset  . Cancer Mother 39  . Stroke Father 61    History   Social History  . Marital Status: Married    Spouse Name: N/A    Number of Children: N/A  . Years of Education: N/A   Occupational History  . disabled    Social History Main Topics  . Smoking status: Former Smoker    Quit date: 04/26/1988  . Smokeless tobacco: Not on file  . Alcohol Use: Not on file  . Drug Use: Not on file  . Sexually Active: Not on file   Other Topics Concern  . Not on file   Social History Narrative  . No narrative on file    ROS: Please see the HPI.  All other systems reviewed and negative.  PHYSICAL EXAM:  BP 128/70  Pulse 70  Ht 5' 4" (1.626 m)  Wt 133 lb (60.328 kg)  BMI 22.83 kg/m2  General: Well developed, well nourished, in no acute distress. Head:  Normocephalic and atraumatic. Neck: no JVD Lungs: Clear to auscultation and percussion. Heart: Normal S1 and S2.  No murmur, rubs or gallops.  Abdomen:  Normal bowel sounds; soft; non tender; no organomegaly Pulses: Pulses normal in all 4 extremities. Extremities: No clubbing or cyanosis. No edema. Neurologic: Alert and oriented x 3.  EKG:  ECHO  Study Conclusions  - Left   ventricle: The cavity size was normal. Wall thickness was normal. Systolic function was normal. The estimated ejection fraction was in the range of 55% to 60%. - Aortic valve: Moderate regurgitation. - Aorta: Mild dilation of the proximal ascending aorta (42 mm). - Mitral valve: Mild regurgitation.    ASSESSMENT AND PLAN:   

## 2011-10-19 NOTE — Telephone Encounter (Signed)
Left message to call back  

## 2011-10-19 NOTE — Discharge Instructions (Signed)

## 2011-10-19 NOTE — Interval H&P Note (Signed)
History and Physical Interval Note:  10/19/2011 7:37 AM  Theresa Weber  has presented today for surgery, with the diagnosis of chest pain  The various methods of treatment have been discussed with the patient.. After consideration of risks, benefits and other options for treatment, the patient has consented to  Procedure(s) (LRB): LEFT HEART CATHETERIZATION WITH CORONARY ANGIOGRAM (N/A) as a surgical intervention .  The patient's history has been reviewed, patient examined, no change in status, stable for surgery.  I have reviewed the patients' chart and labs.  Questions were answered to the patient's satisfaction.     Shawnie Pons

## 2011-10-19 NOTE — Assessment & Plan Note (Signed)
The patient has been scheduled to have surgery which she needs.  She is of intermediate risk based on risk factors, and intermediate risk based on type of surgery performed.  As such, CV function test has been ordered and suggests anterior ischemia with possible WMA.  I have discussed the risks and benefits of proceeding with the patient and she consents.  Based on the cath data, we will decide the risks, benefits, and the location of where the surgery should take place.  She is in agreement and wants to move forward.

## 2011-10-19 NOTE — Assessment & Plan Note (Signed)
Currently controlled on medical therapy.  

## 2011-10-19 NOTE — H&P (View-Only) (Signed)
HPI:  The patient returns in follow up.  She is stable.  She needs surgery, and it is scheduled for [place].  There is an intermediate risk type surgery, and she is limited by inability to do much activity.  We have had a thorough discussion about this.  Based on these factors, we elected to perform outpatient nuclear imaging as the results would likely change the planned approach to her care.  The study suggest ischemia as noted.  See below.    Raw Data Images: Patient motion noted.  Stress Images: There is decreased uptake in the anterior wall.  Rest Images: Normal homogeneous uptake in all areas of the myocardium.  Subtraction (SDS): These findings are consistent with ischemia.  Transient Ischemic Dilatation (Normal <1.22): 1.00  Lung/Heart Ratio (Normal <0.45): 0.17  Quantitative Gated Spect Images  QGS EDV: 92 ml  QGS ESV: 34 ml  Impression  Exercise Capacity: Lexiscan with no exercise.  BP Response: Normal blood pressure response.  Clinical Symptoms: Nausea  ECG Impression: No significant ST segment change suggestive of ischemia.  Comparison with Prior Nuclear Study: No images to compare  Overall Impression: Small area of mild anteroapical ischemia. SDS 5  LV Ejection Fraction: 63% LV Wall Motion: EF calculates as normal but visually anteroapex appears hypokinetic  Charlton Haws   The study, per Dr. Eden Emms, suggested not only ischemia and there was a suggestive of anterior hypokinesis as well.  As such, the patient and I have discussed all possible options.  We have suggested cath as approval for surgery requires permission from [place] to proceed.    No current outpatient prescriptions on file.    Allergies  Allergen Reactions  . Penicillins Other (See Comments)    unknown  . Pentazocine Lactate Other (See Comments)    Unknown    Past Medical History  Diagnosis Date  . Aortic insufficiency   . Hyperlipidemia   . Hypothyroidism     Past Surgical History  Procedure Date   . Choecsytectomy   . Hyterectomy   . Ruptured cervical disk     Family History  Problem Relation Age of Onset  . Cancer Mother 26  . Stroke Father 53    History   Social History  . Marital Status: Married    Spouse Name: N/A    Number of Children: N/A  . Years of Education: N/A   Occupational History  . disabled    Social History Main Topics  . Smoking status: Former Smoker    Quit date: 04/26/1988  . Smokeless tobacco: Not on file  . Alcohol Use: Not on file  . Drug Use: Not on file  . Sexually Active: Not on file   Other Topics Concern  . Not on file   Social History Narrative  . No narrative on file    ROS: Please see the HPI.  All other systems reviewed and negative.  PHYSICAL EXAM:  BP 128/70  Pulse 70  Ht 5\' 4"  (1.626 m)  Wt 133 lb (60.328 kg)  BMI 22.83 kg/m2  General: Well developed, well nourished, in no acute distress. Head:  Normocephalic and atraumatic. Neck: no JVD Lungs: Clear to auscultation and percussion. Heart: Normal S1 and S2.  No murmur, rubs or gallops.  Abdomen:  Normal bowel sounds; soft; non tender; no organomegaly Pulses: Pulses normal in all 4 extremities. Extremities: No clubbing or cyanosis. No edema. Neurologic: Alert and oriented x 3.  EKG:  ECHO  Study Conclusions  - Left  ventricle: The cavity size was normal. Wall thickness was normal. Systolic function was normal. The estimated ejection fraction was in the range of 55% to 60%. - Aortic valve: Moderate regurgitation. - Aorta: Mild dilation of the proximal ascending aorta (42 mm). - Mitral valve: Mild regurgitation.    ASSESSMENT AND PLAN:

## 2011-10-20 NOTE — Telephone Encounter (Signed)
I spoke with Baird Lyons and made her aware that the pt did have a cardiac cath performed 10/19/11 which showed coronary disease.  At this time the pt was instructed to cancel surgery and follow-up in the office with Dr Riley Kill on 10/26/11. Baird Lyons states that the pt has already called and cancelled surgery.

## 2011-10-26 ENCOUNTER — Encounter: Payer: Self-pay | Admitting: Cardiology

## 2011-10-26 ENCOUNTER — Ambulatory Visit (INDEPENDENT_AMBULATORY_CARE_PROVIDER_SITE_OTHER): Payer: Medicare Other | Admitting: Cardiology

## 2011-10-26 VITALS — BP 145/79 | HR 75 | Ht 64.0 in | Wt 136.0 lb

## 2011-10-26 DIAGNOSIS — R931 Abnormal findings on diagnostic imaging of heart and coronary circulation: Secondary | ICD-10-CM

## 2011-10-26 DIAGNOSIS — I359 Nonrheumatic aortic valve disorder, unspecified: Secondary | ICD-10-CM

## 2011-10-26 DIAGNOSIS — R9439 Abnormal result of other cardiovascular function study: Secondary | ICD-10-CM

## 2011-10-26 DIAGNOSIS — E785 Hyperlipidemia, unspecified: Secondary | ICD-10-CM

## 2011-10-26 DIAGNOSIS — I2510 Atherosclerotic heart disease of native coronary artery without angina pectoris: Secondary | ICD-10-CM

## 2011-10-26 DIAGNOSIS — E7800 Pure hypercholesterolemia, unspecified: Secondary | ICD-10-CM

## 2011-10-26 MED ORDER — PRAVASTATIN SODIUM 40 MG PO TABS: 40.0000 mg | ORAL_TABLET | Freq: Every evening | ORAL | Status: DC

## 2011-10-26 NOTE — Progress Notes (Signed)
HPI:  The patient returns today in followup. She's been perfectly stable. She and I reviewed in detail her recent cardiac catheterization information. This included both the mid left anterior descending stenosis as well as aortic regurgitation. I also reviewed the results of her echocardiogram. Discussed with her the fact that we have called her orthopedic surgeon, and will have discussions with him regarding the planned operative procedure. It is my reference that the patient have these done in [place] given the nature of her underlying coronary disease. Overall, she remained stable. Her catheterization site is been stable as well.  Current Outpatient Prescriptions  Medication Sig Dispense Refill  . amitriptyline (ELAVIL) 75 MG tablet Take 75 mg by mouth at bedtime.      . budesonide (RHINOCORT AQUA) 32 MCG/ACT nasal spray Place 1 spray into the nose as needed. For congestion/allergies      . cyclobenzaprine (FLEXERIL) 10 MG tablet Take 10 mg by mouth daily.      . fish oil-omega-3 fatty acids 1000 MG capsule Take 2 g by mouth daily.      Marland Kitchen levothyroxine (SYNTHROID, LEVOTHROID) 50 MCG tablet Take 50 mcg by mouth daily.      . metoprolol (LOPRESSOR) 50 MG tablet Take 25-50 mg by mouth 2 (two) times daily. Take 1 tablet in the morning and 1/2 tablet at night.      . Plant Sterols and Stanols (CHOLEST OFF) 450 MG TABS Take 450 mg by mouth daily.      . Senna (NATURAL VEGETABLE LAXATIVE PO) Take 1-2 tablets by mouth 2 (two) times daily. Take 2 tablets in the morning and 1 tablet at night      . traMADol (ULTRAM) 50 MG tablet Take 50 mg by mouth daily as needed. For pain      . aspirin EC 81 MG tablet Take 1 tablet (81 mg total) by mouth daily.  1 tablet  0  . pravastatin (PRAVACHOL) 40 MG tablet Take 1 tablet (40 mg total) by mouth every evening.  90 tablet  3    Allergies  Allergen Reactions  . Penicillins Other (See Comments)    unknown  . Pentazocine Lactate Other (See Comments)   Unknown  . Talwin (Pentazocine)     Past Medical History  Diagnosis Date  . Aortic insufficiency   . Hyperlipidemia   . Hypothyroidism     Past Surgical History  Procedure Date  . Choecsytectomy   . Hyterectomy   . Ruptured cervical disk     Family History  Problem Relation Age of Onset  . Cancer Mother 52  . Stroke Father 7    History   Social History  . Marital Status: Married    Spouse Name: N/A    Number of Children: N/A  . Years of Education: N/A   Occupational History  . disabled    Social History Main Topics  . Smoking status: Former Smoker    Quit date: 04/26/1988  . Smokeless tobacco: Never Used  . Alcohol Use: Not on file  . Drug Use: Not on file  . Sexually Active: Not on file   Other Topics Concern  . Not on file   Social History Narrative  . No narrative on file    ROS: Please see the HPI.  All other systems reviewed and negative.  PHYSICAL EXAM:  BP 145/79  Pulse 75  Ht 5\' 4"  (1.626 m)  Wt 136 lb (61.689 kg)  BMI 23.34 kg/m2  General: Well developed,  well nourished, in no acute distress. Head:  Normocephalic and atraumatic. Neck: no JVD Lungs: Clear to auscultation and percussion. Heart: Normal S1 and S2.  No murmur, rubs or gallops.  Abdomen:  Normal bowel sounds; soft; non tender; no organomegaly Pulses: Pulses normal in all 4 extremities. Extremities: No clubbing or cyanosis. No edema. Neurologic: Alert and oriented x 3.  EKG:  NSR.  Slight delay in R wave progression, likely due to lead position.   ASSESSMENT AND PLAN:

## 2011-10-26 NOTE — Patient Instructions (Addendum)
Your physician has recommended you make the following change in your medication: START Pravastatin 40mg  take one by mouth daily, START Aspirin 81mg  take one by mouth daily  Your physician recommends that you have lab work today: BMP  Your physician recommends that you return for a FASTING LIPID and LIVER Profile in 6 WEEKS--nothing to eat or drink after midnight, lab opens at 8:30  Your physician recommends that you schedule a follow-up appointment in: 6 WEEKS

## 2011-10-27 LAB — BASIC METABOLIC PANEL
BUN: 25 mg/dL — ABNORMAL HIGH (ref 6–23)
Calcium: 9.2 mg/dL (ref 8.4–10.5)
Creatinine, Ser: 1.1 mg/dL (ref 0.4–1.2)
GFR: 51.36 mL/min — ABNORMAL LOW (ref >60.00)
Glucose, Bld: 93 mg/dL (ref 70–99)
Sodium: 139 mEq/L (ref 135–145)

## 2011-10-29 ENCOUNTER — Telehealth: Payer: Self-pay | Admitting: Cardiology

## 2011-10-29 NOTE — Telephone Encounter (Signed)
Fu call Patient calling back about test results 

## 2011-10-29 NOTE — Telephone Encounter (Signed)
msg left for her to call back.

## 2011-10-29 NOTE — Telephone Encounter (Signed)
F/u   Patient calling back for test results. She can be reached at [phone].

## 2011-10-29 NOTE — Telephone Encounter (Signed)
Spoke with pt, aware of lab results. 

## 2011-10-31 DIAGNOSIS — E785 Hyperlipidemia, unspecified: Secondary | ICD-10-CM | POA: Insufficient documentation

## 2011-10-31 DIAGNOSIS — I2510 Atherosclerotic heart disease of native coronary artery without angina pectoris: Secondary | ICD-10-CM | POA: Insufficient documentation

## 2011-10-31 NOTE — Assessment & Plan Note (Signed)
Has at moderate AI by cath.

## 2011-10-31 NOTE — Assessment & Plan Note (Addendum)
Anatomy reviewed with patient in detail.  She wants to take a few days away.  She needs shoulder surgery, but would prefer this be done in [place] where cardiac services are available.  Have attempted to call her surgeon to check on a [facility] referral.  Continue medical therapy. Also would start an ASA.

## 2011-10-31 NOTE — Assessment & Plan Note (Signed)
Will need to check on this, and try to get her started on a statin if she is able to tolerate.

## 2011-11-01 ENCOUNTER — Telehealth: Payer: Self-pay | Admitting: Cardiology

## 2011-11-01 DIAGNOSIS — I2510 Atherosclerotic heart disease of native coronary artery without angina pectoris: Secondary | ICD-10-CM

## 2011-11-01 NOTE — Telephone Encounter (Signed)
Patient called stated she started taking pravastatin last Tue 10/26/11 and on Sat 11/01/11 urine had a foul odor and she was unable to urinate.Spoke with a pharmacist and he advised to stop pravastatin.States on Sun 11/02/11 was feeling better and now she is much better.Patient wanted Dr.Stuckey to know.Message sent to Dr.Stuckey for advice.

## 2011-11-01 NOTE — Telephone Encounter (Signed)
New problem:  Patient calling C/O on Saturday unable to urinate. Patient call the pharmacy was told to stop medication. Side effect from  pravastatin

## 2011-11-03 NOTE — Telephone Encounter (Signed)
I spoke with the pt and she started pravastatin on 7/2 and took this medication until 7/5.  The pt noticed a foul smell when urinating and also had difficulty with urination.  On Saturday the pt got up and could not urinate all day.  The pt has not taken anymore pravastatin since Saturday and her symptoms have resolved.  The pt would like to know what she needs to do about taking a cholesterol medication.  The pt would also like to know if Dr Riley Kill has found an MD to perform shoulder surgery.

## 2011-11-03 NOTE — Telephone Encounter (Signed)
F/U  Patient calling back to speak with nurse Lauren, regarding pravastatin. Urinate is ok.

## 2011-11-15 NOTE — Telephone Encounter (Signed)
Fu call Pt is calling back about physician for shoulder surgery

## 2011-11-16 NOTE — Telephone Encounter (Signed)
I spoke with the pt and she said that Dr Riley Kill was going to refer her to a surgeon here in [place] because he did not want her to have surgery in [place].  The pt said she is in pain and wants to get her shoulder taken care of ASAP.  I made the pt aware that Dr Riley Kill has attempted to reach her surgeon in [place].  The pt said she is done with that surgeon and is not going back to see him.  I will discuss this pt's concerns with Dr Riley Kill.  Also the pt remains off of her statin drug.  The pt would like to know if she needs to try another medication.  The pt has a follow-up appointment scheduled with Dr Riley Kill on 12/14/11.

## 2011-11-22 NOTE — Telephone Encounter (Signed)
Per Dr Riley Kill this pt should be referred to Dr Josephine Igo for consideration of shoulder surgery.

## 2011-11-30 ENCOUNTER — Encounter: Payer: Self-pay | Admitting: *Deleted

## 2011-11-30 NOTE — Telephone Encounter (Signed)
Appointment with Dr. Teressa Senter on 12/08/11 @ 11am, Theresa Weber made pt aware of appointment.

## 2011-12-10 ENCOUNTER — Telehealth: Payer: Self-pay | Admitting: Cardiology

## 2011-12-10 NOTE — Telephone Encounter (Signed)
New msg Pt wants to talk to you about surgical clearance for shoulder surgery

## 2011-12-10 NOTE — Telephone Encounter (Signed)
Theresa Weber calling stating she is having shoulder surgery sept. 3rd with dr sypher and needs surgical clearance faxed to dr sypher --fax #[phone]--advised i would send message to lauren and dr Riley Kill , and they will fax letter to dr sypher and let her know when this is done--pt agrees

## 2011-12-14 ENCOUNTER — Other Ambulatory Visit: Payer: Medicare Other

## 2011-12-14 ENCOUNTER — Ambulatory Visit: Payer: Medicare Other | Admitting: Cardiology

## 2011-12-14 NOTE — Telephone Encounter (Signed)
Pt is scheduled to see Dr Riley Kill on 12/16/11, will address surgical clearance at that time. I called the pt's home and spoke with her husband.  He states the pt is aware of appointment that is scheduled with Dr Riley Kill.

## 2011-12-16 ENCOUNTER — Encounter: Payer: Self-pay | Admitting: Cardiology

## 2011-12-16 ENCOUNTER — Other Ambulatory Visit (INDEPENDENT_AMBULATORY_CARE_PROVIDER_SITE_OTHER): Payer: Medicare Other

## 2011-12-16 ENCOUNTER — Ambulatory Visit (INDEPENDENT_AMBULATORY_CARE_PROVIDER_SITE_OTHER): Payer: Medicare Other | Admitting: Cardiology

## 2011-12-16 VITALS — BP 120/76 | HR 60 | Ht 64.0 in | Wt 135.2 lb

## 2011-12-16 DIAGNOSIS — I10 Essential (primary) hypertension: Secondary | ICD-10-CM

## 2011-12-16 DIAGNOSIS — E785 Hyperlipidemia, unspecified: Secondary | ICD-10-CM

## 2011-12-16 DIAGNOSIS — R0989 Other specified symptoms and signs involving the circulatory and respiratory systems: Secondary | ICD-10-CM

## 2011-12-16 DIAGNOSIS — I2510 Atherosclerotic heart disease of native coronary artery without angina pectoris: Secondary | ICD-10-CM

## 2011-12-16 NOTE — Patient Instructions (Signed)
Your physician wants you to follow-up in: January/February 2014 with Dr Riley Kill.  You will receive a reminder letter in the mail two months in advance. If you don't receive a letter, please call our office to schedule the follow-up appointment.  Your physician recommends that you continue on your current medications as directed. Please refer to the Current Medication list given to you today.

## 2011-12-18 NOTE — Progress Notes (Addendum)
HPI:  Theresa Weber returns in followup.  She's undergone a rather extensive evaluation. She was going to have a shoulder operation in [place], and they asked for cardiac clearance. We saw her in followup, and this revealed a perfusion defect and she ultimately underwent cardiac catheterization. From a cardiac standpoint, she is been largely stable. However, it was my opinion that the surgery would likely be most safely performed in a facility with immediate access to revascularization options should there be any problem. She seemed a bit upset with me today, but with her I went into great detail about the relationships between surgical risk and types of procedure, and also reviewed with her in detail the role of inflammation and plaque rupture.  Current Outpatient Prescriptions  Medication Sig Dispense Refill  . amitriptyline (ELAVIL) 75 MG tablet Take 75 mg by mouth at bedtime.      Marland Kitchen aspirin EC 81 MG tablet Take 1 tablet (81 mg total) by mouth daily.  1 tablet  0  . budesonide (RHINOCORT AQUA) 32 MCG/ACT nasal spray Place 1 spray into the nose as needed. For congestion/allergies      . ciclopirox (PENLAC) 8 % solution Apply 1 application topically at bedtime. Apply over nail and surrounding skin. Apply daily over previous coat. After seven (7) days, may remove with alcohol and continue cycle.      . cyclobenzaprine (FLEXERIL) 10 MG tablet Take 10 mg by mouth daily.      . fish oil-omega-3 fatty acids 1000 MG capsule Take 2 g by mouth daily.      Marland Kitchen levothyroxine (SYNTHROID, LEVOTHROID) 50 MCG tablet Take 50 mcg by mouth daily.      . meclizine (ANTIVERT) 12.5 MG tablet Take 12.5 mg by mouth 3 (three) times daily as needed.      . metoprolol (LOPRESSOR) 50 MG tablet Take 25-50 mg by mouth 2 (two) times daily. Take 1 tablet in the morning and 1/2 tablet at night.      . Plant Sterols and Stanols (CHOLEST OFF) 450 MG TABS Take 450 mg by mouth daily.      . promethazine (PHENERGAN) 25 MG suppository  Place 25 mg rectally every 6 (six) hours as needed.      . Senna (NATURAL VEGETABLE LAXATIVE PO) Take 1-2 tablets by mouth 2 (two) times daily. Take 2 tablets in the morning and 1 tablet at night      . traMADol (ULTRAM) 50 MG tablet Take 50 mg by mouth daily as needed. For pain        Allergies  Allergen Reactions  . Penicillins Other (See Comments)    unknown  . Pentazocine Lactate Other (See Comments)    Unknown  . Talwin (Pentazocine)     Past Medical History  Diagnosis Date  . Aortic insufficiency   . Hyperlipidemia   . Hypothyroidism     Past Surgical History  Procedure Date  . Choecsytectomy   . Hyterectomy   . Ruptured cervical disk     Family History  Problem Relation Age of Onset  . Cancer Mother 81  . Stroke Father 71    History   Social History  . Marital Status: Married    Spouse Name: N/A    Number of Children: N/A  . Years of Education: N/A   Occupational History  . disabled    Social History Main Topics  . Smoking status: Former Smoker    Quit date: 04/26/1988  . Smokeless tobacco: Never Used  .  Alcohol Use: Not on file  . Drug Use: Not on file  . Sexually Active: Not on file   Other Topics Concern  . Not on file   Social History Narrative  . No narrative on file    ROS: Please see the HPI.  All other systems reviewed and negative.  PHYSICAL EXAM:  BP 120/76  Pulse 60  Ht 5\' 4"  (1.626 m)  Wt 135 lb 3.2 oz (61.326 kg)  BMI 23.21 kg/m2  General: Well developed, well nourished, in no acute distress. Head:  Normocephalic and atraumatic. Neck: no JVD Lungs: Clear to auscultation and percussion. Heart: Normal S1 and S2.  No murmur, rubs or gallops.  Abdomen:  Normal bowel sounds; soft; non tender; no organomegaly Pulses: Pulses normal in all 4 extremities. Extremities: No clubbing or cyanosis. No edema. Neurologic: Alert and oriented x 3.  EKG:  ECHO  (08/2011)  Study Conclusions  - Left ventricle: The cavity size was  normal. Wall thickness was normal. Systolic function was normal. The estimated ejection fraction was in the range of 55% to 60%. - Aortic valve: Moderate regurgitation. - Aorta: Mild dilation of the proximal ascending aorta (42 mm). - Mitral valve: Mild regurgitation  Nuclear study  Impression  Exercise Capacity: Lexiscan with no exercise.  BP Response: Normal blood pressure response.  Clinical Symptoms: Nausea  ECG Impression: No significant ST segment change suggestive of ischemia.  Comparison with Prior Nuclear Study: No images to compare  Overall Impression: Small area of mild anteroapical ischemia. SDS 5  LV Ejection Fraction: 63% LV Wall Motion: EF calculates as normal but visually anteroapex appears hypokinetic  Charlton Haws    Cath data  All three coronaries are moderately calcified, as is the descending aorta and distal aorta.  Left mainstem: 20% ostial calcification  Left anterior descending (LAD): The LAD is calcified. There is a mid stenosis of 560-70% that is relatively smooth. The RAO views appear slightly more severe. There is distal irregularity.  Left circumflex (LCx): Provides one very large marginal branch with mild irregularity, calcification, and no high grade obstruction.  Right coronary artery (RCA): The vessel is heavily calcified in the proximal mid junction. There is 30% mid narrowing that is segmental (lumpy bumpy) but not obstruction. The PLA and PDA are large, with minimal irregularity, and no high grade obstruction.  Left ventriculography: Left ventricular systolic function is normal, LVEF is estimated at 55-65%, there is trace to mild mitral regurgitation  The aortic root is enlarged mildly. There is moderate aortic regurgitation.  Final Conclusions:  1. Asymptomatic moderately high grade mid LAD lesion  2. Heavily calcified aorto and coronary arteries  3. Moderate aortic regurgitation as expected.  Recommendations:  1. Follow up in office next week.    2. Probably move surgery to cardiac facility.  3. Aggressive prevention therapy with statins, ASA, and beta blockade--initial medical therapy.  Shawnie Pons  10/19/2011, 8:55 AM    ASSESSMENT AND PLAN:

## 2011-12-20 ENCOUNTER — Other Ambulatory Visit: Payer: Self-pay | Admitting: Orthopedic Surgery

## 2011-12-22 ENCOUNTER — Telehealth: Payer: Self-pay | Admitting: Cardiology

## 2011-12-22 NOTE — Telephone Encounter (Signed)
Pt was upset because she states was supposed to have shoulder surgery September 3 rd with Dr. Teressa Senter in short stay, now the surgeon said  Dr. Lyndee Leo it ,and  Wants for her to have the surgery done in [facility] hospital. Patient was made aware that Dr. Riley Kill states on his OV note on 12/16/11 that " From the cardiac standpoint, she largely stable. However, it was my opinion that the surgery would likely be most safety performed in a facility with immediate access to revascularization". I explained in words that pt would understand. It  was for her safety. Patient verbalized understanding.

## 2011-12-22 NOTE — Telephone Encounter (Signed)
New problem:  Patient calling suppose to have shoulder surgery next Tuesday.  Wondering why she going to [facility] to have the procedure instead of short stay.

## 2011-12-25 NOTE — Assessment & Plan Note (Signed)
Had a reaction she thought to prava and could not tolerate---so not taking.

## 2011-12-25 NOTE — Assessment & Plan Note (Addendum)
I have tried to explain to the patient that my preference would be for her to have her cath done in a cardiac facility that has cath and PCI capability.  Her nuclear scan suggests mild ischemia, and her cath demonstrates disease in the LAD distribution.  She is not having angina at present.  Her surgery is considered of intermediate risk.  While she should have a good prognosis and fairly low risk, and the procedure clearly needs to be done from a functional standpoint, the safest environment for the patient is where immediate cardiac facilities are available in the event that she has trouble.  Explained carefully and in detail to the patient. I have spoken to Dr. Teressa Senter recently and he plans to proceed with surgery.

## 2011-12-25 NOTE — Assessment & Plan Note (Signed)
Well controlled 

## 2011-12-28 ENCOUNTER — Ambulatory Visit (HOSPITAL_BASED_OUTPATIENT_CLINIC_OR_DEPARTMENT_OTHER): Admission: RE | Admit: 2011-12-28 | Payer: Medicare Other | Source: Ambulatory Visit | Admitting: Orthopedic Surgery

## 2011-12-28 ENCOUNTER — Encounter (HOSPITAL_BASED_OUTPATIENT_CLINIC_OR_DEPARTMENT_OTHER): Admission: RE | Payer: Self-pay | Source: Ambulatory Visit

## 2011-12-28 SURGERY — SHOULDER ARTHROSCOPY WITH SUBACROMIAL DECOMPRESSION AND OPEN ROTATOR CUFF REPAIR, OPEN BICEPS TENDON REPAIR
Anesthesia: Regional | Laterality: Right

## 2012-01-20 ENCOUNTER — Other Ambulatory Visit: Payer: Self-pay | Admitting: Orthopedic Surgery

## 2012-01-25 ENCOUNTER — Telehealth: Payer: Self-pay | Admitting: Cardiology

## 2012-01-25 NOTE — Telephone Encounter (Signed)
Per Dr. Teressa Senter pt needs a letter in epic stating she can have shoulder surgery done as OP at [facility] and go home sameday

## 2012-01-25 NOTE — Telephone Encounter (Signed)
I left a voicemail for Theresa Weber that Dr Riley Kill has documented this information in a phone note.

## 2012-01-25 NOTE — Telephone Encounter (Signed)
In my opinion patient can have her procedure done in an outpatient facility that is near and associated with a cardiac center  [facility]), and that she could be discharged on the same day if she is stable at the time.

## 2012-01-25 NOTE — Progress Notes (Signed)
Called dr syphers office-last note from dr stuckey-pt needed surgery to be done in main OR-marsha at dr syphers office said dr sypher talked with dr stuckey-and he said ok to be done [facility],not main or. Called dr Louretta Shorten office to get that in witting for anesthesia.

## 2012-01-27 ENCOUNTER — Ambulatory Visit (HOSPITAL_BASED_OUTPATIENT_CLINIC_OR_DEPARTMENT_OTHER): Admission: RE | Admit: 2012-01-27 | Payer: Medicare Other | Source: Ambulatory Visit | Admitting: Orthopedic Surgery

## 2012-01-27 ENCOUNTER — Encounter (HOSPITAL_BASED_OUTPATIENT_CLINIC_OR_DEPARTMENT_OTHER): Admission: RE | Payer: Self-pay | Source: Ambulatory Visit

## 2012-01-27 SURGERY — SHOULDER ARTHROSCOPY WITH OPEN ROTATOR CUFF REPAIR AND DISTAL CLAVICLE ACROMINECTOMY
Anesthesia: General | Laterality: Right

## 2012-07-21 ENCOUNTER — Ambulatory Visit: Payer: Medicare Other | Admitting: Cardiology

## 2012-07-26 ENCOUNTER — Ambulatory Visit (INDEPENDENT_AMBULATORY_CARE_PROVIDER_SITE_OTHER): Payer: Medicare Other | Admitting: Cardiology

## 2012-07-26 ENCOUNTER — Encounter: Payer: Self-pay | Admitting: Cardiology

## 2012-07-26 ENCOUNTER — Telehealth: Payer: Self-pay | Admitting: Cardiology

## 2012-07-26 VITALS — BP 104/60 | HR 62 | Ht 64.0 in | Wt 139.1 lb

## 2012-07-26 DIAGNOSIS — I350 Nonrheumatic aortic (valve) stenosis: Secondary | ICD-10-CM

## 2012-07-26 DIAGNOSIS — E785 Hyperlipidemia, unspecified: Secondary | ICD-10-CM

## 2012-07-26 DIAGNOSIS — I359 Nonrheumatic aortic valve disorder, unspecified: Secondary | ICD-10-CM

## 2012-07-26 DIAGNOSIS — I2510 Atherosclerotic heart disease of native coronary artery without angina pectoris: Secondary | ICD-10-CM

## 2012-07-26 DIAGNOSIS — I10 Essential (primary) hypertension: Secondary | ICD-10-CM

## 2012-07-26 MED ORDER — ATORVASTATIN CALCIUM 10 MG PO TABS: ORAL_TABLET | ORAL | Status: DC

## 2012-07-26 MED ORDER — ATORVASTATIN CALCIUM 10 MG PO TABS: 10.0000 mg | ORAL_TABLET | Freq: Three times a day (TID) | ORAL | Status: DC

## 2012-07-26 NOTE — Telephone Encounter (Signed)
New problem   Pharmacy has question about what the pt need to take for  Lipitor 10mg . Prescription was just fax to them.

## 2012-07-26 NOTE — Progress Notes (Signed)
HPI:  She is doing well.  She rehabed on her own. Shoulder is great.  Did not have surgery.  We discussed again.  Cannot take pravachol but wants to try something else generic.  We reviewed the findings of her aortic regurgitation.   Current Outpatient Prescriptions  Medication Sig Dispense Refill  . amitriptyline (ELAVIL) 75 MG tablet Take 100 mg by mouth at bedtime.       Marland Kitchen aspirin EC 81 MG tablet Take 1 tablet (81 mg total) by mouth daily.  1 tablet  0  . budesonide (RHINOCORT AQUA) 32 MCG/ACT nasal spray Place 1 spray into the nose as needed. For congestion/allergies      . cyclobenzaprine (FLEXERIL) 10 MG tablet Take 10 mg by mouth daily.      . fish oil-omega-3 fatty acids 1000 MG capsule Take 2 g by mouth daily.      Marland Kitchen levothyroxine (SYNTHROID, LEVOTHROID) 50 MCG tablet Take 75 mcg by mouth daily.       . meclizine (ANTIVERT) 12.5 MG tablet Take 12.5 mg by mouth 3 (three) times daily as needed.      . metoprolol (LOPRESSOR) 50 MG tablet Take 25-50 mg by mouth 2 (two) times daily. Take 1 tablet in the morning and 1/2 tablet at night.      . Plant Sterols and Stanols (CHOLEST OFF) 450 MG TABS Take 450 mg by mouth daily.      . promethazine (PHENERGAN) 25 MG suppository Place 25 mg rectally every 6 (six) hours as needed.      . Senna (NATURAL VEGETABLE LAXATIVE PO) Take 1-2 tablets by mouth 2 (two) times daily. Take 2 tablets in the morning and 1 tablet at night      . traMADol (ULTRAM) 50 MG tablet Take 50 mg by mouth daily as needed. For pain       No current facility-administered medications for this visit.    Allergies  Allergen Reactions  . Penicillins Other (See Comments)    unknown  . Pentazocine Lactate Other (See Comments)    Unknown  . Talwin (Pentazocine)     Past Medical History  Diagnosis Date  . Aortic insufficiency   . Hyperlipidemia   . Hypothyroidism     Past Surgical History  Procedure Laterality Date  . Choecsytectomy    . Hyterectomy    . Ruptured  cervical disk      Family History  Problem Relation Age of Onset  . Cancer Mother 45  . Stroke Father 82    History   Social History  . Marital Status: Married    Spouse Name: N/A    Number of Children: N/A  . Years of Education: N/A   Occupational History  . disabled    Social History Main Topics  . Smoking status: Former Smoker    Quit date: 04/26/1988  . Smokeless tobacco: Never Used  . Alcohol Use: Not on file  . Drug Use: Not on file  . Sexually Active: Not on file   Other Topics Concern  . Not on file   Social History Narrative  . No narrative on file    ROS: Please see the HPI.  All other systems reviewed and negative.  PHYSICAL EXAM:  BP 104/60  Pulse 62  Ht 5\' 4"  (1.626 m)  Wt 139 lb 1.9 oz (63.104 kg)  BMI 23.87 kg/m2  General: Well developed, well nourished, in no acute distress. Head:  Normocephalic and atraumatic. Neck: no JVD Lungs:  Clear to auscultation and percussion. Heart: Normal S1 and S2. 1-2/6 early diastolic blow.   Pulses: Pulses normal in all 4 extremities. Extremities: No clubbing or cyanosis. No edema. Neurologic: Alert and oriented x 3.  EKG:  NSR with first degree av block.  Pr 214 ms.  No acute changes.  Delay in  R wave progression.  ECHO Study Conclusions  - Left ventricle: The cavity size was normal. Wall thickness was normal. Systolic function was normal. The estimated ejection fraction was in the range of 55% to 60%. - Aortic valve: Moderate regurgitation. - Aorta: Mild dilation of the proximal ascending aorta (42 mm). - Mitral valve: Mild regurgitation.  EDD 48 mm ESD 32 mm  Cath  09/2011  Coronary angiography:  Coronary dominance: right  All three coronaries are moderately calcified, as is the descending aorta and distal aorta.  Left mainstem: 20% ostial calcification  Left anterior descending (LAD): The LAD is calcified. There is a mid stenosis of 560-70% that is relatively smooth. The RAO views appear  slightly more severe. There is distal irregularity.  Left circumflex (LCx): Provides one very large marginal branch with mild irregularity, calcification, and no high grade obstruction.  Right coronary artery (RCA): The vessel is heavily calcified in the proximal mid junction. There is 30% mid narrowing that is segmental (lumpy bumpy) but not obstruction. The PLA and PDA are large, with minimal irregularity, and no high grade obstruction.  Left ventriculography: Left ventricular systolic function is normal, LVEF is estimated at 55-65%, there is trace to mild mitral regurgitation  The aortic root is enlarged mildly. There is moderate aortic regurgitation.  Final Conclusions:  1. Asymptomatic moderately high grade mid LAD lesion  2. Heavily calcified aorto and coronary arteries  3. Moderate aortic regurgitation as expected.  Recommendations:  1. Follow up in office next week.  2. Probably move surgery to cardiac facility.  3. Aggressive prevention therapy with statins, ASA, and beta blockade--initial medical therapy.  Shawnie Pons  10/19/2011, 8:55 AM      ASSESSMENT AND PLAN:  1.  Asymptomatic CAD with moderate LAD stenosis with abnormal myoview, medically treated.  2.  Aortic regurgitation, moderate with mild enlargement of the aortic root. 3.  Hypertension 4.  Dsylipidemia   Plan  Continued fu with echo and OV with Dr. Excell Seltzer

## 2012-07-26 NOTE — Telephone Encounter (Signed)
Clarification of Atorvastatin given to pharmacy= Tammy Mylo Red RN

## 2012-07-26 NOTE — Patient Instructions (Addendum)
Follow-up with Dr. Excell Seltzer in 6 months & have an Echocardiogram same day.  Your physician has requested that you have an echocardiogram. Echocardiography is a painless test that uses sound waves to create images of your heart. It provides your doctor with information about the size and shape of your heart and how well your heart's chambers and valves are working. This procedure takes approximately one hour. There are no restrictions for this procedure.  Start Atorvastatin 10mg  1 tablet three times a week  You will need to have your fasting cholesterol and lfts checked in 6 weeks with your primary care doctor.

## 2012-07-30 NOTE — Assessment & Plan Note (Signed)
Currently moderate with normal LV dimensions.  Echo follow up planned with Dr. Excell Seltzer in about six months.

## 2012-07-30 NOTE — Assessment & Plan Note (Signed)
No current symptoms.  Discussed in detail.  Will pursue medical therapy for now.

## 2012-07-30 NOTE — Assessment & Plan Note (Signed)
Wants to be on something.  We discussed some options.  Will try atorvastatin 10mg  MWF to start and assess symptoms.

## 2012-07-30 NOTE — Assessment & Plan Note (Signed)
Controlled.  

## 2012-07-31 NOTE — Progress Notes (Signed)
Would you be able to arrange a CXR for her. She should have this to look at the aorta. Thanks TS  I spoke with the pt and she would like to have a CXR performed by her PCP (Dr Nathanial Rancher) when she follows up in May.  I instructed the pt to have a copy of CXR sent to our office.  Pt verbalized understanding.

## 2012-09-21 ENCOUNTER — Encounter: Payer: Self-pay | Admitting: Cardiology

## 2013-01-10 ENCOUNTER — Ambulatory Visit (INDEPENDENT_AMBULATORY_CARE_PROVIDER_SITE_OTHER): Payer: Medicare Other | Admitting: Cardiovascular Disease

## 2013-01-10 ENCOUNTER — Encounter: Payer: Self-pay | Admitting: Cardiovascular Disease

## 2013-01-10 ENCOUNTER — Ambulatory Visit (HOSPITAL_COMMUNITY): Payer: Medicare Other | Attending: Cardiology | Admitting: Radiology

## 2013-01-10 VITALS — BP 130/88 | HR 80 | Ht 64.0 in | Wt 139.0 lb

## 2013-01-10 DIAGNOSIS — I10 Essential (primary) hypertension: Secondary | ICD-10-CM | POA: Insufficient documentation

## 2013-01-10 DIAGNOSIS — I350 Nonrheumatic aortic (valve) stenosis: Secondary | ICD-10-CM

## 2013-01-10 DIAGNOSIS — I2510 Atherosclerotic heart disease of native coronary artery without angina pectoris: Secondary | ICD-10-CM

## 2013-01-10 DIAGNOSIS — I359 Nonrheumatic aortic valve disorder, unspecified: Secondary | ICD-10-CM

## 2013-01-10 DIAGNOSIS — I351 Nonrheumatic aortic (valve) insufficiency: Secondary | ICD-10-CM

## 2013-01-10 DIAGNOSIS — Z87891 Personal history of nicotine dependence: Secondary | ICD-10-CM | POA: Insufficient documentation

## 2013-01-10 DIAGNOSIS — E785 Hyperlipidemia, unspecified: Secondary | ICD-10-CM | POA: Insufficient documentation

## 2013-01-10 NOTE — Progress Notes (Signed)
Echocardiogram performed.  

## 2013-01-10 NOTE — Patient Instructions (Addendum)
Your physician wants you to follow-up in: 85 MONTHS with Dr Excell Seltzer.  You will receive a reminder letter in the mail two months in advance. If you don't receive a letter, please call our office to schedule the follow-up appointment.  Your physician has requested that you have an echocardiogram in 18 MONTHS. Echocardiography is a painless test that uses sound waves to create images of your heart. It provides your doctor with information about the size and shape of your heart and how well your heart's chambers and valves are working. This procedure takes approximately one hour. There are no restrictions for this procedure.  Your physician recommends that you continue on your current medications as directed. Please refer to the Current Medication list given to you today.

## 2013-01-12 ENCOUNTER — Encounter: Payer: Self-pay | Admitting: Cardiovascular Disease

## 2013-01-12 NOTE — Progress Notes (Signed)
HPI:  67 year old woman presenting for followup evaluation. The patient has been followed for aortic insufficiency and nonobstructive coronary artery disease. She has been followed by Dr. Riley Kill. The patient had an echocardiogram this morning demonstrating normal left ventricular size and systolic function with mild left ventricular hypertrophy. Her left ventricular dimensions were normal with a diastolic dimension of 43 and a systolic dimension of 35 mm. There was mild to moderate aortic insufficiency. The aortic root was mildly dilated at 37 mm in the ascending aorta was 42 mm.  From a symptomatic perspective she is doing well. She denies chest pain, shortness of breath, edema, orthopnea, or PND. She has no concerns today.  Outpatient Encounter Prescriptions as of 01/10/2013  Medication Sig Dispense Refill  . amitriptyline (ELAVIL) 75 MG tablet Take 100 mg by mouth at bedtime.       Marland Kitchen aspirin EC 81 MG tablet Take 1 tablet (81 mg total) by mouth daily.  1 tablet  0  . budesonide (RHINOCORT AQUA) 32 MCG/ACT nasal spray Place 1 spray into the nose as needed. For congestion/allergies      . cyclobenzaprine (FLEXERIL) 10 MG tablet Take 10 mg by mouth daily.      . fish oil-omega-3 fatty acids 1000 MG capsule Take 2 g by mouth daily.      Marland Kitchen levothyroxine (SYNTHROID, LEVOTHROID) 50 MCG tablet Take 75 mcg by mouth daily.       . meclizine (ANTIVERT) 12.5 MG tablet Take 12.5 mg by mouth 3 (three) times daily as needed.      . metoprolol (LOPRESSOR) 50 MG tablet Take 25-50 mg by mouth 2 (two) times daily. Take 1 tablet in the morning and 1/2 tablet at night.      . Misc Natural Products (FIBER 7) POWD Take 1 scoop by mouth daily. Take one scoop each morning      . Plant Sterols and Stanols (CHOLEST OFF) 450 MG TABS Take 450 mg by mouth daily.      . promethazine (PHENERGAN) 25 MG suppository Place 25 mg rectally every 6 (six) hours as needed.      . Senna (NATURAL VEGETABLE LAXATIVE PO) Take 1-2 tablets  by mouth 2 (two) times daily. Take 2 tablets in the morning and 1 tablet at night      . traMADol (ULTRAM) 50 MG tablet Take 50 mg by mouth daily as needed. For pain      . atorvastatin (LIPITOR) 10 MG tablet 1 tablet 3 times a week  90 tablet  3   No facility-administered encounter medications on file as of 01/10/2013.    Allergies  Allergen Reactions  . Penicillins Other (See Comments)    unknown  . Pentazocine Lactate Other (See Comments)    Unknown  . Talwin [Pentazocine]     Past Medical History  Diagnosis Date  . Aortic insufficiency   . Hyperlipidemia   . Hypothyroidism     ROS: Negative except as per HPI  BP 130/88  Pulse 80  Ht 5\' 4"  (1.626 m)  Wt 139 lb (63.05 kg)  BMI 23.85 kg/m2  PHYSICAL EXAM: Pt is alert and oriented, NAD HEENT: normal Neck: JVP - normal, carotids 2+= without bruits Lungs: CTA bilaterally CV: RRR with grade 2/6 diastolic decrescendo murmur best heard at the right upper sternal border Abd: soft, NT, Positive BS, no hepatomegaly Ext: no C/C/E, distal pulses intact and equal Skin: warm/dry no rash  2-D echocardiogram 01/10/2013: Left ventricle: The cavity size was normal.  Wall thickness was increased in a pattern of mild LVH. The estimated ejection fraction was 60%. Wall motion was normal; there were no regional wall motion abnormalities. Doppler parameters are consistent with abnormal left ventricular relaxation (grade 1 diastolic dysfunction).  ------------------------------------------------------------ Aortic valve: The AI is central. Sclerosis without stenosis. Doppler: Mild to moderate regurgitation.  ------------------------------------------------------------ Aorta: Mild dilitation of the ascending aorta.  ------------------------------------------------------------ Mitral valve: Structurally normal valve. Leaflet separation was normal. Doppler: Transvalvular velocity was within the normal range. There was no evidence for  stenosis. No regurgitation.  ------------------------------------------------------------ Left atrium: The atrium was at the upper limits of normal in size.  ------------------------------------------------------------ Right ventricle: The cavity size was normal. Systolic function was normal.  ------------------------------------------------------------ Pulmonic valve: The valve appears to be grossly normal. Doppler: No significant regurgitation.  ------------------------------------------------------------ Tricuspid valve: Structurally normal valve. Leaflet separation was normal. Doppler: Transvalvular velocity was within the normal range. No regurgitation.  ------------------------------------------------------------ Right atrium: The atrium was at the upper limits of normal in size.  ------------------------------------------------------------ Pericardium: There was no pericardial effusion.  ------------------------------------------------------------ Post procedure conclusions Ascending Aorta:  - Mild dilitation of the ascending aorta.  ------------------------------------------------------------  2D measurements Normal Doppler measurements Normal Left ventricle Left ventricle LVID ED, 42.5 mm 43-52 Ea, lat 6.03 cm/s ------ chord, ann, tiss PLAX DP LVID ES, 35 mm 23-38 E/Ea, lat 7.61 ------ chord, ann, tiss PLAX DP FS, chord, 18 % >29 Ea, med 3.29 cm/s ------ PLAX ann, tiss LVPW, ED 10.8 mm ------ DP IVS/LVPW 0.86 <1.3 E/Ea, med 13.95 ------ ratio, ED ann, tiss Ventricular septum DP IVS, ED 9.31 mm ------ LVOT LVOT Peak vel, 119 cm/s ------ Diam, S 21 mm ------ S Area 3.46 cm^2 ------ VTI, S 27.4 cm ------ Diam 21 mm ------ Peak 6 mm Hg ------ Aorta gradient, Root diam, 37 mm ------ S ED Stroke vol 94.9 ml ------ AAo AP 42 mm ------ Stroke 56.5 ml/m^ ------ diam, S index 2 Left atrium Aortic valve AP dim 37 mm ------ Regurg PHT 432 ms ------ AP dim 2.2  cm/m^2 <2.2 Mitral valve index Peak E vel 45.9 cm/s ------ Peak A vel 88.8 cm/s ------ Decelerati 356 ms 150-23 on time 0 Peak E/A 0.5 ------ ratio Right ventricle Sa vel, 11.4 cm/s ------ lat ann, tiss DP  ASSESSMENT AND PLAN: 1. Moderate aortic insufficiency. The patient's left ventricular systolic function and dimensions are within normal limits. She has New [place] Heart Association class I symptoms. Will continue observation. She requests 18 month followup. Will repeat an echo at that time. She has been stable for several years. I reviewed her echo images today.  2. Coronary artery disease, nonobstructive. I reviewed her cardiac catheterization films. She has moderate LAD stenosis and I agree with medical therapy. She is on appropriate regimen with aspirin and metoprolol. She is unable to take statin drugs.  For followup I will see her back in 18 months. No changes were made to her medical program.  Tonny Bollman 01/12/2013 5:56 PM

## 2014-04-04 ENCOUNTER — Encounter (HOSPITAL_COMMUNITY): Payer: Self-pay | Admitting: Cardiology

## 2014-07-09 ENCOUNTER — Encounter: Payer: Self-pay | Admitting: Cardiovascular Disease

## 2014-07-11 ENCOUNTER — Encounter: Payer: Self-pay | Admitting: Cardiovascular Disease

## 2014-07-11 ENCOUNTER — Other Ambulatory Visit (HOSPITAL_COMMUNITY): Payer: Self-pay | Admitting: Cardiovascular Disease

## 2014-07-11 ENCOUNTER — Ambulatory Visit (INDEPENDENT_AMBULATORY_CARE_PROVIDER_SITE_OTHER): Payer: Medicare HMO | Admitting: Cardiovascular Disease

## 2014-07-11 ENCOUNTER — Ambulatory Visit: Payer: Medicare Other | Admitting: Cardiovascular Disease

## 2014-07-11 ENCOUNTER — Ambulatory Visit (HOSPITAL_COMMUNITY): Payer: Medicare HMO | Attending: Internal Medicine | Admitting: Radiology

## 2014-07-11 VITALS — BP 138/84 | HR 57 | Ht 64.0 in | Wt 136.1 lb

## 2014-07-11 DIAGNOSIS — I351 Nonrheumatic aortic (valve) insufficiency: Secondary | ICD-10-CM

## 2014-07-11 DIAGNOSIS — E785 Hyperlipidemia, unspecified: Secondary | ICD-10-CM

## 2014-07-11 DIAGNOSIS — I10 Essential (primary) hypertension: Secondary | ICD-10-CM

## 2014-07-11 DIAGNOSIS — I359 Nonrheumatic aortic valve disorder, unspecified: Secondary | ICD-10-CM

## 2014-07-11 NOTE — Patient Instructions (Signed)
Your physician has requested that you have an echocardiogram in 18 MONTHS. Echocardiography is a painless test that uses sound waves to create images of your heart. It provides your doctor with information about the size and shape of your heart and how well your heart's chambers and valves are working. This procedure takes approximately one hour. There are no restrictions for this procedure.  Your physician wants you to follow-up in: 42 MONTHS with Dr Burt Knack. You will receive a reminder letter in the mail two months in advance. If you don't receive a letter, please call our office to schedule the follow-up appointment.  Your physician recommends that you continue on your current medications as directed. Please refer to the Current Medication list given to you today.

## 2014-07-11 NOTE — Progress Notes (Signed)
Echocardiogram performed.  

## 2014-07-11 NOTE — Progress Notes (Signed)
Cardiology Office Note   Date:  07/11/2014   ID:  Theresa Weber, DOB [birth year], MRN [id number]  PCP:  Leonides Sake, MD  Cardiologist:  Sherren Mocha, MD    Chief Complaint  Patient presents with  . Coronary Artery Disease     History of Present Illness: Theresa Weber is a 69 y.o. female who presents for follow-up evaluation. The patient has been followed for aortic insufficiency and nonobstructive CAD. She's also been found to have mild aortic dilatation.  The patient feels well. She has no complaints today related to her heart. She specifically denies chest pain, shortness of breath, lightheadedness, or heart palpitations. States her blood pressure has been under good control. It stresses her out to come to [place].   Past Medical History  Diagnosis Date  . Aortic insufficiency   . Hyperlipidemia   . Hypothyroidism     Past Surgical History  Procedure Laterality Date  . Choecsytectomy    . Hyterectomy    . Ruptured cervical disk    . Left heart catheterization with coronary angiogram N/A 10/19/2011    Procedure: LEFT HEART CATHETERIZATION WITH CORONARY ANGIOGRAM;  Surgeon: Hillary Bow, MD;  Location: [facility] CATH LAB;  Service: Cardiovascular;  Laterality: N/A;    Current Outpatient Prescriptions  Medication Sig Dispense Refill  . amitriptyline (ELAVIL) 100 MG tablet Take 1 tablet by mouth at bedtime.    Marland Kitchen aspirin EC 81 MG tablet Take 1 tablet (81 mg total) by mouth daily. 1 tablet 0  . budesonide (RHINOCORT AQUA) 32 MCG/ACT nasal spray Place 1 spray into the nose daily as needed. For congestion/allergies    . cyclobenzaprine (FLEXERIL) 10 MG tablet Take 10 mg by mouth daily.    Marland Kitchen levothyroxine (SYNTHROID, LEVOTHROID) 75 MCG tablet Take 1 tablet by mouth daily before breakfast.    . meclizine (ANTIVERT) 12.5 MG tablet Take 12.5 mg by mouth 3 (three) times daily as needed for dizziness or nausea.     . metoprolol (LOPRESSOR) 50 MG tablet Take 25-50 mg by  mouth 2 (two) times daily. Take 1 tablet in the morning and 1/2 tablet at night.    . Misc Natural Products (FIBER 7) POWD Take one scoop by mouth each morning as needed for constipation    . Omega-3 Fatty Acids (OMEGA-3 FISH OIL) 1200 MG CAPS Take 2 capsules by mouth daily.    Marland Kitchen omeprazole (PRILOSEC) 20 MG capsule Take 20 mg by mouth daily as needed (heartburn or acid reflux).    . Plant Sterols and Stanols (CHOLEST OFF) 450 MG TABS Take 450 mg by mouth daily.    . promethazine (PHENERGAN) 25 MG suppository Place 25 mg rectally every 6 (six) hours as needed for nausea, vomiting or refractory nausea / vomiting.     . Senna (NATURAL VEGETABLE LAXATIVE PO) Take 1-3 tablets by mouth daily. Take 2 tablets in the morning and 1 tablet at night    . traMADol (ULTRAM) 50 MG tablet Take 50 mg by mouth daily as needed. For pain    . vitamin E 400 UNIT capsule Take 400 Units by mouth daily.     No current facility-administered medications for this visit.    Allergies:   Atorvastatin; Pravastatin; Penicillins; Pentazocine lactate; Talwin; and Terbinafine and related   Social History:  The patient  reports that she quit smoking about 26 years ago. She has never used smokeless tobacco.   Family History:  The patient's  family history includes Cancer (age  of onset: 58) in her mother; Stroke (age of onset: 26) in her father.    ROS:  Please see the history of present illness.  Otherwise, review of systems is positive for back and leg pain.  All other systems are reviewed and negative.    PHYSICAL EXAM: VS:  BP 138/84 mmHg  Pulse 57  Ht 5\' 4"  (1.626 m)  Wt 136 lb 1.9 oz (61.744 kg)  BMI 23.35 kg/m2 , BMI Body mass index is 23.35 kg/(m^2). GEN: Well nourished, well developed, in no acute distress HEENT: normal Neck: no JVD, no masses. No carotid bruits Cardiac: RRR with soft diastolic decrescendo murmur at the left sternal border               Respiratory:  clear to auscultation bilaterally, normal  work of breathing GI: soft, nontender, nondistended, + BS MS: no deformity or atrophy Ext: no pretibial edema, pedal pulses 2+= bilaterally Skin: warm and dry, no rash Neuro:  Strength and sensation are intact Psych: euthymic mood, full affect  EKG:  EKG is ordered today. The ekg ordered today shows sinus bradycardia 57 bpm, possible age-indeterminate septal infarct age undetermined, nonspecific ST abnormality  Recent Labs: No results found for requested labs within last 365 days.   Lipid Panel  No results found for: CHOL, TRIG, HDL, CHOLHDL, VLDL, LDLCALC, LDLDIRECT    Wt Readings from Last 3 Encounters:  07/11/14 136 lb 1.9 oz (61.744 kg)  01/10/13 139 lb (63.05 kg)  07/26/12 139 lb 1.9 oz (63.104 kg)     Cardiac Studies Reviewed: 2-D echocardiogram performed this morning was personally reviewed. This shows normal LV size and systolic function, moderate aortic insufficiency, and dilatation of the ascending aorta with maximum diameter 4.4 cm.  ASSESSMENT AND PLAN: 1.  Mild to moderate aortic insufficiency: This appears stable from previous echo studies. Will await formal interpretation. Anticipate ongoing observation with a repeat echocardiogram in 18 months and a follow-up visit at that time per the patient's request. She does not prefer frequent follow-up.  2. Aortic root dilatation. The patient is on a beta blocker. Maximum diameter of the ascending aorta is 4.4 cm. Will repeat her echocardiogram at the time of her next visit. If the ascending aorta measures greater than 4.5 cm, will likely order a CTA.  3. Hyperlipidemia. The patient is statin intolerant. She tried pravastatin but couldn't take this. I reviewed her lipids from December and her LDL is less than 100 without medication.  4. Nonobstructive coronary artery disease: No anginal symptoms. She continues on low-dose aspirin and is tolerating this well. She is also on a beta blocker.   Current medicines are reviewed  with the patient today.  The patient does not have concerns regarding medicines.  The following changes have been made:  no change  Labs/ tests ordered today include:   Orders Placed This Encounter  Procedures  . EKG 12-Lead    Disposition:   FU 18 months with an echo and office visit  Signed, Sherren Mocha, MD  07/11/2014 5:56 PM    [address], [place],   [address] Phone: [phone]; Fax: [phone]

## 2016-03-11 ENCOUNTER — Other Ambulatory Visit: Payer: Self-pay | Admitting: Cardiovascular Disease

## 2016-03-11 DIAGNOSIS — I351 Nonrheumatic aortic (valve) insufficiency: Secondary | ICD-10-CM

## 2016-03-12 ENCOUNTER — Ambulatory Visit (INDEPENDENT_AMBULATORY_CARE_PROVIDER_SITE_OTHER): Payer: Medicare HMO | Admitting: Cardiovascular Disease

## 2016-03-12 ENCOUNTER — Ambulatory Visit (HOSPITAL_COMMUNITY): Payer: Medicare HMO | Attending: Internal Medicine

## 2016-03-12 ENCOUNTER — Other Ambulatory Visit: Payer: Self-pay

## 2016-03-12 ENCOUNTER — Encounter: Payer: Self-pay | Admitting: Cardiovascular Disease

## 2016-03-12 VITALS — BP 120/80 | HR 60 | Ht 63.0 in | Wt 132.6 lb

## 2016-03-12 DIAGNOSIS — I77810 Thoracic aortic ectasia: Secondary | ICD-10-CM

## 2016-03-12 DIAGNOSIS — I2510 Atherosclerotic heart disease of native coronary artery without angina pectoris: Secondary | ICD-10-CM | POA: Diagnosis not present

## 2016-03-12 DIAGNOSIS — I351 Nonrheumatic aortic (valve) insufficiency: Secondary | ICD-10-CM

## 2016-03-12 DIAGNOSIS — I10 Essential (primary) hypertension: Secondary | ICD-10-CM | POA: Insufficient documentation

## 2016-03-12 DIAGNOSIS — E785 Hyperlipidemia, unspecified: Secondary | ICD-10-CM | POA: Insufficient documentation

## 2016-03-12 DIAGNOSIS — Z87891 Personal history of nicotine dependence: Secondary | ICD-10-CM | POA: Diagnosis not present

## 2016-03-12 DIAGNOSIS — I080 Rheumatic disorders of both mitral and aortic valves: Secondary | ICD-10-CM | POA: Diagnosis not present

## 2016-03-12 DIAGNOSIS — I4891 Unspecified atrial fibrillation: Secondary | ICD-10-CM | POA: Insufficient documentation

## 2016-03-12 NOTE — Patient Instructions (Signed)
Medication Instructions:  Your physician recommends that you continue on your current medications as directed. Please refer to the Current Medication list given to you today.  Labwork: No new orders.   Testing/Procedures: Your physician has requested that you have an echocardiogram in 18 MONTHS. Echocardiography is a painless test that uses sound waves to create images of your heart. It provides your doctor with information about the size and shape of your heart and how well your heart's chambers and valves are working. This procedure takes approximately one hour. There are no restrictions for this procedure.  Non-Cardiac CT Angiography (CTA), is a special type of CT scan that uses a computer to produce multi-dimensional views of major blood vessels throughout the body. In CT angiography, a contrast material is injected through an IV to help visualize the blood vessels. You will need a CTA Chest Aorta in 18 MONTHS.   Follow-Up: Your physician wants you to follow-up in: 43 MONTHS with Dr Burt Knack.  You will receive a reminder letter in the mail two months in advance. If you don't receive a letter, please call our office to schedule the follow-up appointment.   Any Other Special Instructions Will Be Listed Below (If Applicable).     If you need a refill on your cardiac medications before your next appointment, please call your pharmacy.

## 2016-03-12 NOTE — Progress Notes (Signed)
Cardiology Office Note Date:  03/12/2016   ID:  PENINA SODER, DOB February 04, 1946, MRN [id number]  PCP:  Leonides Sake, MD  Cardiologist:  Sherren Mocha, MD    Chief Complaint  Patient presents with  . Coronary Artery Disease     History of Present Illness: Theresa Weber is a 70 y.o. female who presents for follow-up evaluation. The patient has been followed for aortic insufficiency and nonobstructive CAD. She's also been found to have mild aortic dilatation.  The patient is here alone today. She is doing fine. She has no cardiac related complaints. Today, she denies symptoms of palpitations, chest pain, shortness of breath, orthopnea, PND, lower extremity edema, dizziness, or syncope. She is primarily limited by low back pain radiating to her legs.   Past Medical History:  Diagnosis Date  . Aortic insufficiency   . Hyperlipidemia   . Hypothyroidism     Past Surgical History:  Procedure Laterality Date  . choecsytectomy    . hyterectomy    . LEFT HEART CATHETERIZATION WITH CORONARY ANGIOGRAM N/A 10/19/2011   Procedure: LEFT HEART CATHETERIZATION WITH CORONARY ANGIOGRAM;  Surgeon: Hillary Bow, MD;  Location: [facility] CATH LAB;  Service: Cardiovascular;  Laterality: N/A;  . ruptured cervical disk      Current Outpatient Prescriptions  Medication Sig Dispense Refill  . amitriptyline (ELAVIL) 100 MG tablet Take 1 tablet by mouth at bedtime.    Marland Kitchen aspirin EC 81 MG tablet Take 1 tablet (81 mg total) by mouth daily. 1 tablet 0  . budesonide (RHINOCORT AQUA) 32 MCG/ACT nasal spray Place 1 spray into the nose daily as needed. For congestion/allergies    . cyclobenzaprine (FLEXERIL) 10 MG tablet Take 10 mg by mouth daily.    Marland Kitchen levothyroxine (SYNTHROID, LEVOTHROID) 88 MCG tablet Take 88 mcg by mouth daily before breakfast.    . meclizine (ANTIVERT) 12.5 MG tablet Take 12.5 mg by mouth 3 (three) times daily as needed for dizziness or nausea.     . metoprolol (LOPRESSOR) 50 MG  tablet Take 25-50 mg by mouth 2 (two) times daily. Take 1 tablet in the morning and 1/2 tablet at night.    . Misc Natural Products (FIBER 7) POWD Take one scoop by mouth each morning as needed for constipation    . Omega-3 Fatty Acids (OMEGA-3 FISH OIL) 1200 MG CAPS Take 2 capsules by mouth daily.    Marland Kitchen omeprazole (PRILOSEC) 20 MG capsule Take 20 mg by mouth daily as needed (heartburn or acid reflux).    . Plant Sterols and Stanols (CHOLEST OFF) 450 MG TABS Take 450 mg by mouth daily.    . promethazine (PHENERGAN) 25 MG suppository Place 25 mg rectally every 6 (six) hours as needed for nausea, vomiting or refractory nausea / vomiting.     . Senna (NATURAL VEGETABLE LAXATIVE PO) Take 1-3 tablets by mouth daily. Take 2 tablets in the morning and 1 tablet at night    . traMADol (ULTRAM) 50 MG tablet Take 50 mg by mouth daily as needed. For pain     No current facility-administered medications for this visit.     Allergies:   Atorvastatin; Pravastatin; Morphine and related; Penicillin g; Penicillins; Pentazocine lactate; Statins; Talwin [pentazocine]; and Terbinafine and related   Social History:  The patient  reports that she quit smoking about 27 years ago. She has never used smokeless tobacco.   Family History:  The patient's  family history includes Cancer (age of onset: 55) in her  mother; Stroke (age of onset: 55) in her father.    ROS:  Please see the history of present illness.  Otherwise, review of systems is positive for Back pain, muscle pain, easy bruising, leg pain.  All other systems are reviewed and negative.    PHYSICAL EXAM: VS:  BP 120/80   Pulse 60   Ht 5\' 3"  (1.6 m)   Wt 132 lb 9.6 oz (60.1 kg)   BMI 23.49 kg/m  , BMI Body mass index is 23.49 kg/m. GEN: Well nourished, well developed, in no acute distress  HEENT: normal  Neck: no JVD, no masses. No carotid bruits Cardiac: RRR with grade 2/6 systolic murmur at the right upper sternal border and grade 2/6 diastolic  decrescendo murmur at the right upper sternal border            Respiratory:  clear to auscultation bilaterally, normal work of breathing GI: soft, nontender, nondistended, + BS MS: no deformity or atrophy  Ext: no pretibial edema, pedal pulses 2+= bilaterally Skin: warm and dry, no rash Neuro:  Strength and sensation are intact Psych: euthymic mood, full affect  EKG:  EKG is ordered today. The ekg ordered today shows sinus rhythm 59 bpm, age-indeterminate septal infarct. Nonspecific ST abnormality.  Recent Labs: No results found for requested labs within last 8760 hours.   Lipid Panel  No results found for: CHOL, TRIG, HDL, CHOLHDL, VLDL, LDLCALC, LDLDIRECT    Wt Readings from Last 3 Encounters:  03/12/16 132 lb 9.6 oz (60.1 kg)  07/11/14 136 lb 1.9 oz (61.7 kg)  01/10/13 139 lb (63 kg)     Cardiac Studies Reviewed: 2-D echo images are personally reviewed: On my review the patient's left ventricular function appears normal. The LV is not significantly dilated. There is moderate aortic valve insufficiency. The aortic root is dilated. Formal interpretation is currently pending.  ASSESSMENT AND PLAN: 1.  Moderate aortic valve insufficiency, asymptomatic without significant LV dysfunction or dilatation. Continue observation and medical therapy. The patient does not have a wide pulse pressure. She has no symptoms attributable to her valvular heart disease. Will continue with follow-up in 18 months.  2. Dilated aortic root: I recommended a CT angiogram to better assess. The patient declined. She is willing to have this test done when she returns in 18 months but did not want to come back to [facility] for any testing in the interim.  3. Hyperlipidemia: Statin intolerant.  Continue with lifestyle modification, diet, and exercise as tolerated.  Current medicines are reviewed with the patient today.  The patient does not have concerns regarding medicines.  Labs/ tests ordered today  include:  No orders of the defined types were placed in this encounter.   Disposition:   FU 18 months with an echocardiogram and CTA of the chest to assess chronic aortic insufficiency and dilated aortic root.   Deatra James, MD  03/12/2016 11:27 AM    [facility] [place], [place], [place]  [address] Phone: [phone]; Fax: [phone]

## 2016-03-15 ENCOUNTER — Other Ambulatory Visit: Payer: Self-pay

## 2016-03-15 DIAGNOSIS — I351 Nonrheumatic aortic (valve) insufficiency: Secondary | ICD-10-CM

## 2016-04-30 DIAGNOSIS — E2839 Other primary ovarian failure: Secondary | ICD-10-CM | POA: Diagnosis not present

## 2016-04-30 DIAGNOSIS — N183 Chronic kidney disease, stage 3 (moderate): Secondary | ICD-10-CM | POA: Diagnosis not present

## 2016-04-30 DIAGNOSIS — K219 Gastro-esophageal reflux disease without esophagitis: Secondary | ICD-10-CM | POA: Diagnosis not present

## 2016-04-30 DIAGNOSIS — Z6824 Body mass index (BMI) 24.0-24.9, adult: Secondary | ICD-10-CM | POA: Diagnosis not present

## 2016-04-30 DIAGNOSIS — Z1231 Encounter for screening mammogram for malignant neoplasm of breast: Secondary | ICD-10-CM | POA: Diagnosis not present

## 2016-04-30 DIAGNOSIS — I10 Essential (primary) hypertension: Secondary | ICD-10-CM | POA: Diagnosis not present

## 2016-04-30 DIAGNOSIS — E7800 Pure hypercholesterolemia, unspecified: Secondary | ICD-10-CM | POA: Diagnosis not present

## 2016-04-30 DIAGNOSIS — E039 Hypothyroidism, unspecified: Secondary | ICD-10-CM | POA: Diagnosis not present

## 2016-04-30 DIAGNOSIS — M545 Low back pain: Secondary | ICD-10-CM | POA: Diagnosis not present

## 2016-04-30 DIAGNOSIS — Z9181 History of falling: Secondary | ICD-10-CM | POA: Diagnosis not present

## 2016-04-30 DIAGNOSIS — Z79899 Other long term (current) drug therapy: Secondary | ICD-10-CM | POA: Diagnosis not present

## 2016-04-30 DIAGNOSIS — I2510 Atherosclerotic heart disease of native coronary artery without angina pectoris: Secondary | ICD-10-CM | POA: Diagnosis not present

## 2016-04-30 DIAGNOSIS — D72829 Elevated white blood cell count, unspecified: Secondary | ICD-10-CM | POA: Diagnosis not present

## 2016-05-14 DIAGNOSIS — E2839 Other primary ovarian failure: Secondary | ICD-10-CM | POA: Diagnosis not present

## 2016-05-14 DIAGNOSIS — M818 Other osteoporosis without current pathological fracture: Secondary | ICD-10-CM | POA: Diagnosis not present

## 2016-05-14 DIAGNOSIS — Z1231 Encounter for screening mammogram for malignant neoplasm of breast: Secondary | ICD-10-CM | POA: Diagnosis not present

## 2016-05-14 DIAGNOSIS — M810 Age-related osteoporosis without current pathological fracture: Secondary | ICD-10-CM | POA: Diagnosis not present

## 2016-06-01 DIAGNOSIS — C44729 Squamous cell carcinoma of skin of left lower limb, including hip: Secondary | ICD-10-CM | POA: Diagnosis not present

## 2016-07-05 DIAGNOSIS — L309 Dermatitis, unspecified: Secondary | ICD-10-CM | POA: Diagnosis not present

## 2016-07-05 DIAGNOSIS — Z6824 Body mass index (BMI) 24.0-24.9, adult: Secondary | ICD-10-CM | POA: Diagnosis not present

## 2016-07-05 DIAGNOSIS — L299 Pruritus, unspecified: Secondary | ICD-10-CM | POA: Diagnosis not present

## 2016-07-17 DIAGNOSIS — R112 Nausea with vomiting, unspecified: Secondary | ICD-10-CM | POA: Diagnosis not present

## 2016-07-17 DIAGNOSIS — R42 Dizziness and giddiness: Secondary | ICD-10-CM | POA: Diagnosis not present

## 2016-07-17 DIAGNOSIS — H8110 Benign paroxysmal vertigo, unspecified ear: Secondary | ICD-10-CM | POA: Diagnosis not present

## 2016-07-17 DIAGNOSIS — R404 Transient alteration of awareness: Secondary | ICD-10-CM | POA: Diagnosis not present

## 2016-10-20 DIAGNOSIS — K1120 Sialoadenitis, unspecified: Secondary | ICD-10-CM | POA: Diagnosis not present

## 2016-10-20 DIAGNOSIS — J029 Acute pharyngitis, unspecified: Secondary | ICD-10-CM | POA: Diagnosis not present

## 2016-10-26 DIAGNOSIS — K1121 Acute sialoadenitis: Secondary | ICD-10-CM | POA: Diagnosis not present

## 2016-10-26 DIAGNOSIS — R220 Localized swelling, mass and lump, head: Secondary | ICD-10-CM | POA: Diagnosis not present

## 2016-10-26 DIAGNOSIS — J342 Deviated nasal septum: Secondary | ICD-10-CM | POA: Diagnosis not present

## 2016-11-12 DIAGNOSIS — L304 Erythema intertrigo: Secondary | ICD-10-CM | POA: Diagnosis not present

## 2016-11-12 DIAGNOSIS — Z1389 Encounter for screening for other disorder: Secondary | ICD-10-CM | POA: Diagnosis not present

## 2016-11-12 DIAGNOSIS — M810 Age-related osteoporosis without current pathological fracture: Secondary | ICD-10-CM | POA: Diagnosis not present

## 2016-11-12 DIAGNOSIS — M545 Low back pain: Secondary | ICD-10-CM | POA: Diagnosis not present

## 2016-11-12 DIAGNOSIS — E039 Hypothyroidism, unspecified: Secondary | ICD-10-CM | POA: Diagnosis not present

## 2016-11-12 DIAGNOSIS — I10 Essential (primary) hypertension: Secondary | ICD-10-CM | POA: Diagnosis not present

## 2016-11-12 DIAGNOSIS — N183 Chronic kidney disease, stage 3 (moderate): Secondary | ICD-10-CM | POA: Diagnosis not present

## 2016-11-12 DIAGNOSIS — I2510 Atherosclerotic heart disease of native coronary artery without angina pectoris: Secondary | ICD-10-CM | POA: Diagnosis not present

## 2016-11-12 DIAGNOSIS — E7800 Pure hypercholesterolemia, unspecified: Secondary | ICD-10-CM | POA: Diagnosis not present

## 2016-11-12 DIAGNOSIS — L309 Dermatitis, unspecified: Secondary | ICD-10-CM | POA: Diagnosis not present

## 2016-11-12 DIAGNOSIS — Z6823 Body mass index (BMI) 23.0-23.9, adult: Secondary | ICD-10-CM | POA: Diagnosis not present

## 2016-11-12 DIAGNOSIS — Z79899 Other long term (current) drug therapy: Secondary | ICD-10-CM | POA: Diagnosis not present

## 2017-02-09 DIAGNOSIS — H26493 Other secondary cataract, bilateral: Secondary | ICD-10-CM | POA: Diagnosis not present

## 2017-02-09 DIAGNOSIS — H04123 Dry eye syndrome of bilateral lacrimal glands: Secondary | ICD-10-CM | POA: Diagnosis not present

## 2017-02-18 DIAGNOSIS — Z79899 Other long term (current) drug therapy: Secondary | ICD-10-CM | POA: Diagnosis not present

## 2017-02-18 DIAGNOSIS — Z23 Encounter for immunization: Secondary | ICD-10-CM | POA: Diagnosis not present

## 2017-02-18 DIAGNOSIS — I10 Essential (primary) hypertension: Secondary | ICD-10-CM | POA: Diagnosis not present

## 2017-02-18 DIAGNOSIS — M545 Low back pain: Secondary | ICD-10-CM | POA: Diagnosis not present

## 2017-02-18 DIAGNOSIS — I2510 Atherosclerotic heart disease of native coronary artery without angina pectoris: Secondary | ICD-10-CM | POA: Diagnosis not present

## 2017-02-18 DIAGNOSIS — N183 Chronic kidney disease, stage 3 (moderate): Secondary | ICD-10-CM | POA: Diagnosis not present

## 2017-02-18 DIAGNOSIS — N393 Stress incontinence (female) (male): Secondary | ICD-10-CM | POA: Diagnosis not present

## 2017-02-18 DIAGNOSIS — Z6823 Body mass index (BMI) 23.0-23.9, adult: Secondary | ICD-10-CM | POA: Diagnosis not present

## 2017-03-07 DIAGNOSIS — L570 Actinic keratosis: Secondary | ICD-10-CM | POA: Diagnosis not present

## 2017-03-07 DIAGNOSIS — C44519 Basal cell carcinoma of skin of other part of trunk: Secondary | ICD-10-CM | POA: Diagnosis not present

## 2017-03-07 DIAGNOSIS — D0462 Carcinoma in situ of skin of left upper limb, including shoulder: Secondary | ICD-10-CM | POA: Diagnosis not present

## 2017-03-11 DIAGNOSIS — Z6823 Body mass index (BMI) 23.0-23.9, adult: Secondary | ICD-10-CM | POA: Diagnosis not present

## 2017-03-11 DIAGNOSIS — I10 Essential (primary) hypertension: Secondary | ICD-10-CM | POA: Diagnosis not present

## 2017-03-24 DIAGNOSIS — D045 Carcinoma in situ of skin of trunk: Secondary | ICD-10-CM | POA: Diagnosis not present

## 2017-05-05 DIAGNOSIS — Z0000 Encounter for general adult medical examination without abnormal findings: Secondary | ICD-10-CM | POA: Diagnosis not present

## 2017-05-05 DIAGNOSIS — E785 Hyperlipidemia, unspecified: Secondary | ICD-10-CM | POA: Diagnosis not present

## 2017-05-05 DIAGNOSIS — Z1231 Encounter for screening mammogram for malignant neoplasm of breast: Secondary | ICD-10-CM | POA: Diagnosis not present

## 2017-05-05 DIAGNOSIS — Z136 Encounter for screening for cardiovascular disorders: Secondary | ICD-10-CM | POA: Diagnosis not present

## 2017-05-05 DIAGNOSIS — Z139 Encounter for screening, unspecified: Secondary | ICD-10-CM | POA: Diagnosis not present

## 2017-05-05 DIAGNOSIS — Z9181 History of falling: Secondary | ICD-10-CM | POA: Diagnosis not present

## 2017-05-16 DIAGNOSIS — Z1231 Encounter for screening mammogram for malignant neoplasm of breast: Secondary | ICD-10-CM | POA: Diagnosis not present

## 2017-05-30 DIAGNOSIS — N183 Chronic kidney disease, stage 3 (moderate): Secondary | ICD-10-CM | POA: Diagnosis not present

## 2017-05-30 DIAGNOSIS — E039 Hypothyroidism, unspecified: Secondary | ICD-10-CM | POA: Diagnosis not present

## 2017-05-30 DIAGNOSIS — N393 Stress incontinence (female) (male): Secondary | ICD-10-CM | POA: Diagnosis not present

## 2017-05-30 DIAGNOSIS — J309 Allergic rhinitis, unspecified: Secondary | ICD-10-CM | POA: Diagnosis not present

## 2017-05-30 DIAGNOSIS — Z6824 Body mass index (BMI) 24.0-24.9, adult: Secondary | ICD-10-CM | POA: Diagnosis not present

## 2017-05-30 DIAGNOSIS — I10 Essential (primary) hypertension: Secondary | ICD-10-CM | POA: Diagnosis not present

## 2017-07-04 ENCOUNTER — Ambulatory Visit: Payer: Medicare HMO | Admitting: Cardiovascular Disease

## 2017-07-04 ENCOUNTER — Ambulatory Visit (HOSPITAL_COMMUNITY): Payer: PPO | Attending: Cardiovascular Disease

## 2017-07-04 ENCOUNTER — Ambulatory Visit (INDEPENDENT_AMBULATORY_CARE_PROVIDER_SITE_OTHER)
Admission: RE | Admit: 2017-07-04 | Discharge: 2017-07-04 | Disposition: A | Discharge status: Home / Self Care | Payer: PPO | Source: Ambulatory Visit | Attending: Cardiovascular Disease | Admitting: Cardiovascular Disease

## 2017-07-04 ENCOUNTER — Other Ambulatory Visit: Payer: Self-pay | Admitting: Cardiovascular Disease

## 2017-07-04 ENCOUNTER — Encounter: Payer: Self-pay | Admitting: Cardiovascular Disease

## 2017-07-04 ENCOUNTER — Other Ambulatory Visit: Payer: Self-pay

## 2017-07-04 VITALS — BP 150/84 | HR 62 | Ht 64.0 in | Wt 135.0 lb

## 2017-07-04 DIAGNOSIS — I2510 Atherosclerotic heart disease of native coronary artery without angina pectoris: Secondary | ICD-10-CM | POA: Insufficient documentation

## 2017-07-04 DIAGNOSIS — I712 Thoracic aortic aneurysm, without rupture, unspecified: Secondary | ICD-10-CM

## 2017-07-04 DIAGNOSIS — E785 Hyperlipidemia, unspecified: Secondary | ICD-10-CM | POA: Insufficient documentation

## 2017-07-04 DIAGNOSIS — I77810 Thoracic aortic ectasia: Secondary | ICD-10-CM | POA: Diagnosis not present

## 2017-07-04 DIAGNOSIS — Z87891 Personal history of nicotine dependence: Secondary | ICD-10-CM | POA: Diagnosis not present

## 2017-07-04 DIAGNOSIS — I351 Nonrheumatic aortic (valve) insufficiency: Secondary | ICD-10-CM | POA: Insufficient documentation

## 2017-07-04 MED ORDER — IOPAMIDOL (ISOVUE-370) INJECTION 76%
100.0000 mL | Freq: Once | INTRAVENOUS | Status: AC | PRN
  Administered 2017-07-04: 100 mL via INTRAVENOUS

## 2017-07-04 NOTE — Progress Notes (Signed)
Cardiology Office Note Date:  07/04/2017   ID:  Theresa Weber, DOB 07-31-1945, MRN [id number]  PCP:  Leonides Sake, MD  Cardiologist:  Sherren Mocha, MD    Chief Complaint  Patient presents with  . Follow-up    Aortic aneurysm     History of Present Illness: Theresa Weber is a 72 y.o. female who presents for chronic aortic insufficiency, mild aortic dilatation, and nonobstructive CAD.  She had a severe dizziness last year and was hospitalized at [facility] for further evaluation. She was told that she did not have a stroke and her brain scans were normal. She has continued to have problems with frequent dizziness, worse when she is up on her feet. She's been diagnosed with vertigo and is treated with meclizine and medication for nausea.  She states that several of her siblings have had the same problem.  She denies true postural symptoms.  She denies any recent chest pain or shortness of breath.  She brings in lab work demonstrating a cholesterol of 175, HDL 49, and LDL 96.  Creatinine is 1.21, liver function tests are normal.    Past Medical History:  Diagnosis Date  . Aortic insufficiency   . Hyperlipidemia   . Hypothyroidism     Past Surgical History:  Procedure Laterality Date  . choecsytectomy    . hyterectomy    . LEFT HEART CATHETERIZATION WITH CORONARY ANGIOGRAM N/A 10/19/2011   Procedure: LEFT HEART CATHETERIZATION WITH CORONARY ANGIOGRAM;  Surgeon: Hillary Bow, MD;  Location: [facility] CATH LAB;  Service: Cardiovascular;  Laterality: N/A;  . ruptured cervical disk      Current Outpatient Medications  Medication Sig Dispense Refill  . amitriptyline (ELAVIL) 100 MG tablet Take 1 tablet by mouth at bedtime.    . budesonide (RHINOCORT AQUA) 32 MCG/ACT nasal spray Place 1 spray into the nose daily as needed. For congestion/allergies    . cyclobenzaprine (FLEXERIL) 10 MG tablet Take 10 mg by mouth daily.    Marland Kitchen levothyroxine (SYNTHROID, LEVOTHROID) 88 MCG  tablet Take 88 mcg by mouth daily before breakfast.    . meclizine (ANTIVERT) 12.5 MG tablet Take 12.5 mg by mouth 3 (three) times daily as needed for dizziness or nausea.     . metoprolol (LOPRESSOR) 50 MG tablet Take 25 mg by mouth 2 (two) times daily.     . Misc Natural Products (FIBER 7) POWD Take one scoop by mouth each morning as needed for constipation    . omeprazole (PRILOSEC) 20 MG capsule Take 20 mg by mouth daily as needed (heartburn or acid reflux).    . polyethylene glycol (MIRALAX / GLYCOLAX) packet Take 17 g by mouth daily.    . promethazine (PHENERGAN) 25 MG suppository Place 25 mg rectally every 6 (six) hours as needed for nausea, vomiting or refractory nausea / vomiting.     . traMADol (ULTRAM) 50 MG tablet Take 50 mg by mouth daily as needed. For pain     No current facility-administered medications for this visit.     Allergies:   Atorvastatin; Pravastatin; Morphine and related; Penicillin g; Penicillins; Pentazocine lactate; Statins; Talwin [pentazocine]; and Terbinafine and related   Social History:  The patient  reports that she quit smoking about 29 years ago. she has never used smokeless tobacco.   Family History:  The patient's family history includes Cancer (age of onset: 75) in her mother; Stroke (age of onset: 54) in her father.   ROS:  Please see  the history of present illness.  Otherwise, review of systems is positive for back pain, dizziness, easy bruising, excessive sweating, leg pain, nausea.  All other systems are reviewed and negative.   PHYSICAL EXAM: VS:  BP (!) 150/84   Pulse 62   Ht 5\' 4"  (1.626 m)   Wt 135 lb (61.2 kg)   BMI 23.17 kg/m  , BMI Body mass index is 23.17 kg/m. GEN: Well nourished, well developed, in no acute distress  HEENT: normal  Neck: no JVD, no masses. right carotid bruit - transmitted heart murmur Cardiac: RRR with 2/6 early peaking systolic murmur and 2/6 diastolic decrescendo murmur at the right upper sternal border                 Respiratory:  clear to auscultation bilaterally, normal work of breathing GI: soft, nontender, nondistended, + BS MS: no deformity or atrophy  Ext: no pretibial edema, pedal pulses 2+= bilaterally Skin: warm and dry, no rash Neuro:  Strength and sensation are intact Psych: euthymic mood, full affect  EKG:  EKG is ordered today. The ekg ordered today shows sinus rhythm 62 bpm, first-degree AV block, age-indeterminate septal infarct.  Nonspecific ST abnormality.  Recent Labs: No results found for requested labs within last 8760 hours.   Lipid Panel  No results found for: CHOL, TRIG, HDL, CHOLHDL, VLDL, LDLCALC, LDLDIRECT    Wt Readings from Last 3 Encounters:  07/04/17 135 lb (61.2 kg)  03/12/16 132 lb 9.6 oz (60.1 kg)  07/11/14 136 lb 1.9 oz (61.7 kg)     Cardiac Studies Reviewed: CTA Chest 07-04-2017: IMPRESSION: 1. 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 No evidence of thoracic aortic dissection or periaortic stranding on this nongated examination. 2. Suspected hemodynamically significant narrowings involving the origins of the right brachiocephalic and left subclavian arteries. Further evaluation with dedicated neck CTA could be performed as indicated. 3. Enlargement of the caliber the main pulmonary artery as could be seen in the setting of pulmonary arterial hypertension. Further evaluation cardiac echo could be performed as indicated. 4. Indeterminate right lower lobe pulmonary nodules, largest of which measures 7 mm in diameter. Non-contrast chest CT at 3-6 months is recommended. If the nodules are stable at time of repeat CT, then future CT at 18-24 months (from today's scan) is considered optional for low-risk patients, but is recommended for high-risk patients.  ASSESSMENT AND PLAN: 1.  Aortic valve regurgitation, moderate asymptomatic: The patient does not have significant LV dilatation or  LV dysfunction.  She will continue with surveillance echo studies and medical therapy.  Her echo interpretation is currently pending, but I reviewed the images.  LV function is preserved.  There is moderate aortic valve insufficiency with a pressure half-time of 418 and deceleration slope of 276.  Her LV end-diastolic diameter is 5.3 cm.  Recommend continued medical therapy.  2.  Dilated aortic root: CTA of the chest is reviewed.  The ascending aorta measures 4.6 cm in maximal diameter.  There is extensive atherosclerotic change at the origin of the head and neck vessels.  Ongoing medical therapy is recommended.  The patient remains asymptomatic.  Her blood pressure has been running about 130 over 70s on current medicine.  Recommend a repeat CT study next year  3.  Hyperlipidemia: Patient has been statin intolerant.  Discussed the need for aggressive secondary risk reduction measures.  She is not interested in taking any lipid-lowering therapy.  She is intolerant  to statins and states that she will not take any other medicine for her cholesterol.  For follow-up I will see her back in 18 months per her request and repeat imaging studies at that time.  Current medicines are reviewed with the patient today.  The patient does not have concerns regarding medicines.  Labs/ tests ordered today include:   Orders Placed This Encounter  Procedures  . CT ANGIO CHEST AORTA W &/OR WO CONTRAST  . ECHOCARDIOGRAM COMPLETE   Disposition:   FU 18 months  Signed, Sherren Mocha, MD  07/04/2017 1:40 PM    [facility] [place], [place], [place]  [address] Phone: [phone]; Fax: [phone]

## 2017-07-04 NOTE — Patient Instructions (Signed)
Medication Instructions:  Your provider recommends that you continue on your current medications as directed. Please refer to the Current Medication list given to you today.    Labwork: None  Testing/Procedures: Your provider has requested that you have an echocardiogram in 18 months. Echocardiography is a painless test that uses sound waves to create images of your heart. It provides your doctor with information about the size and shape of your heart and how well your heart's chambers and valves are working. This procedure takes approximately one hour. There are no restrictions for this procedure.    Dr. Burt Knack recommends you have a CHEST CTA in 18 months.  Follow-Up: Your provider wants you to follow-up in: 10 month with Dr. Burt Knack. You will receive a reminder letter in the mail two months in advance. If you don't receive a letter, please call our office to schedule the follow-up appointment.    Any Other Special Instructions Will Be Listed Below (If Applicable).     If you need a refill on your cardiac medications before your next appointment, please call your pharmacy.

## 2017-07-06 NOTE — Addendum Note (Signed)
Addended by: Harland German A on: 07/06/2017 08:22 AM   Modules accepted: Orders

## 2017-07-07 ENCOUNTER — Telehealth: Payer: Self-pay

## 2017-07-07 DIAGNOSIS — R911 Solitary pulmonary nodule: Secondary | ICD-10-CM

## 2017-07-07 NOTE — Telephone Encounter (Signed)
Informed patient of results and verbal understanding expressed.  Patient agrees to noncontrasted CT in 6 months. Order placed for scheduling. Patient was grateful for call.

## 2017-07-07 NOTE — Telephone Encounter (Signed)
-----   Message from Sherren Mocha, MD sent at 07/04/2017  5:23 PM EDT ----- Reviewed vascular findings with the patient at her office visit today.  Recommend a repeat study in 18 months.  I did not review the finding of the small pulmonary nodule with her.  A follow-up CT is recommended in 3-6 months.  Please order a noncontrasted CT of the chest in 6 months if the patient is willing.

## 2017-07-19 DIAGNOSIS — R42 Dizziness and giddiness: Secondary | ICD-10-CM | POA: Diagnosis not present

## 2017-07-19 DIAGNOSIS — H8110 Benign paroxysmal vertigo, unspecified ear: Secondary | ICD-10-CM | POA: Diagnosis not present

## 2017-07-19 DIAGNOSIS — H9319 Tinnitus, unspecified ear: Secondary | ICD-10-CM | POA: Diagnosis not present

## 2017-07-19 DIAGNOSIS — H9193 Unspecified hearing loss, bilateral: Secondary | ICD-10-CM | POA: Diagnosis not present

## 2017-07-19 DIAGNOSIS — J342 Deviated nasal septum: Secondary | ICD-10-CM | POA: Diagnosis not present

## 2017-07-19 DIAGNOSIS — H903 Sensorineural hearing loss, bilateral: Secondary | ICD-10-CM | POA: Diagnosis not present

## 2017-07-21 DIAGNOSIS — C44612 Basal cell carcinoma of skin of right upper limb, including shoulder: Secondary | ICD-10-CM | POA: Diagnosis not present

## 2017-07-21 DIAGNOSIS — C44519 Basal cell carcinoma of skin of other part of trunk: Secondary | ICD-10-CM | POA: Diagnosis not present

## 2017-07-21 DIAGNOSIS — L570 Actinic keratosis: Secondary | ICD-10-CM | POA: Diagnosis not present

## 2017-08-18 DIAGNOSIS — J309 Allergic rhinitis, unspecified: Secondary | ICD-10-CM | POA: Diagnosis not present

## 2017-08-18 DIAGNOSIS — Z6823 Body mass index (BMI) 23.0-23.9, adult: Secondary | ICD-10-CM | POA: Diagnosis not present

## 2017-08-18 DIAGNOSIS — R42 Dizziness and giddiness: Secondary | ICD-10-CM | POA: Diagnosis not present

## 2017-08-26 DIAGNOSIS — I700 Atherosclerosis of aorta: Secondary | ICD-10-CM | POA: Diagnosis not present

## 2017-08-26 DIAGNOSIS — R42 Dizziness and giddiness: Secondary | ICD-10-CM | POA: Diagnosis not present

## 2017-08-26 DIAGNOSIS — R05 Cough: Secondary | ICD-10-CM | POA: Diagnosis not present

## 2017-08-26 DIAGNOSIS — J309 Allergic rhinitis, unspecified: Secondary | ICD-10-CM | POA: Diagnosis not present

## 2017-08-26 DIAGNOSIS — Z6824 Body mass index (BMI) 24.0-24.9, adult: Secondary | ICD-10-CM | POA: Diagnosis not present

## 2017-09-28 DIAGNOSIS — I10 Essential (primary) hypertension: Secondary | ICD-10-CM | POA: Diagnosis not present

## 2017-09-28 DIAGNOSIS — Z6824 Body mass index (BMI) 24.0-24.9, adult: Secondary | ICD-10-CM | POA: Diagnosis not present

## 2017-09-28 DIAGNOSIS — R42 Dizziness and giddiness: Secondary | ICD-10-CM | POA: Diagnosis not present

## 2017-10-05 ENCOUNTER — Emergency Department (EMERGENCY_DEPARTMENT_HOSPITAL): Payer: Medicare PPO

## 2017-10-05 ENCOUNTER — Emergency Department (HOSPITAL_COMMUNITY): Payer: Medicare PPO | Admitting: Certified Registered"

## 2017-10-05 ENCOUNTER — Inpatient Hospital Stay
Admission: EM | Admit: 2017-10-05 | Discharge: 2017-10-06 | DRG: 299 | Disposition: A | Discharge status: Hospice | Payer: Medicare PPO | Attending: Internal Medicine | Admitting: Internal Medicine

## 2017-10-05 ENCOUNTER — Inpatient Hospital Stay (HOSPITAL_COMMUNITY): Payer: Medicare PPO | Admitting: Thoracic Surgery (Cardiothoracic Vascular Surgery)

## 2017-10-05 ENCOUNTER — Encounter (HOSPITAL_COMMUNITY)
Admission: EM | Disposition: A | Discharge status: Hospice | Payer: Self-pay | Source: Home / Self Care | Attending: Thoracic Surgery (Cardiothoracic Vascular Surgery)

## 2017-10-05 ENCOUNTER — Emergency Department (HOSPITAL_COMMUNITY): Payer: Medicare PPO

## 2017-10-05 DIAGNOSIS — R402223 Coma scale, best verbal response, incomprehensible words, at hospital admission: Secondary | ICD-10-CM | POA: Diagnosis not present

## 2017-10-05 DIAGNOSIS — Z515 Encounter for palliative care: Secondary | ICD-10-CM | POA: Diagnosis not present

## 2017-10-05 DIAGNOSIS — R000 Tachycardia, unspecified: Secondary | ICD-10-CM | POA: Diagnosis not present

## 2017-10-05 DIAGNOSIS — I7101 Dissection of thoracic aorta: Secondary | ICD-10-CM | POA: Diagnosis not present

## 2017-10-05 DIAGNOSIS — R4189 Other symptoms and signs involving cognitive functions and awareness: Secondary | ICD-10-CM | POA: Diagnosis not present

## 2017-10-05 DIAGNOSIS — R4182 Altered mental status, unspecified: Secondary | ICD-10-CM | POA: Diagnosis not present

## 2017-10-05 DIAGNOSIS — R570 Cardiogenic shock: Secondary | ICD-10-CM | POA: Diagnosis not present

## 2017-10-05 DIAGNOSIS — R079 Chest pain, unspecified: Secondary | ICD-10-CM | POA: Diagnosis not present

## 2017-10-05 DIAGNOSIS — R627 Adult failure to thrive: Secondary | ICD-10-CM | POA: Diagnosis not present

## 2017-10-05 DIAGNOSIS — I7100 Dissection of unspecified site of aorta: Secondary | ICD-10-CM | POA: Diagnosis not present

## 2017-10-05 DIAGNOSIS — I959 Hypotension, unspecified: Secondary | ICD-10-CM | POA: Diagnosis not present

## 2017-10-05 DIAGNOSIS — I4581 Long QT syndrome: Secondary | ICD-10-CM | POA: Diagnosis not present

## 2017-10-05 DIAGNOSIS — Z66 Do not resuscitate: Secondary | ICD-10-CM | POA: Diagnosis not present

## 2017-10-05 DIAGNOSIS — R402363 Coma scale, best motor response, obeys commands, at hospital admission: Secondary | ICD-10-CM | POA: Diagnosis not present

## 2017-10-05 DIAGNOSIS — R402133 Coma scale, eyes open, to sound, at hospital admission: Secondary | ICD-10-CM | POA: Diagnosis not present

## 2017-10-05 DIAGNOSIS — R918 Other nonspecific abnormal finding of lung field: Secondary | ICD-10-CM

## 2017-10-05 LAB — LIPID PANEL
CHOL/HDL RATIO: 3.5
HDL CHOL: 48 mg/dL — ABNORMAL LOW (ref >=49)
NON-HDL: 118 mg/dL (ref <=190)
TRIGLYCERIDES: 147 mg/dL (ref <=150)

## 2017-10-05 LAB — CROSSMATCH RED CELLS - UNITS
UNIT DIVISION: 0
UNIT DIVISION: 0

## 2017-10-05 LAB — MANUAL DIFF AND MORPHOLOGY-SYSMEX
BASOPHIL #: <0.1 x10ˆ3/uL (ref <=0.20)
BASOPHIL %: 0 %
EOSINOPHIL #: <0.1 10*3/uL (ref <=0.50)
EOSINOPHIL %: 0 %
EOSINOPHIL %: 0 %
LYMPHOCYTE #: 5.81 x10ˆ3/uL — ABNORMAL HIGH (ref 1.00–4.80)
MONOCYTE #: 0.79 10*3/uL (ref 0.20–1.10)
MONOCYTE %: 5 %
NEUTROPHIL #: 9.11 x10ˆ3/uL — ABNORMAL HIGH (ref 1.50–7.70)
NEUTROPHIL %: 58 %
NRBC FROM MANUAL DIFF: 0 /100{WBCs}
RBC MORPHOLOGY: NORMAL

## 2017-10-05 LAB — THYROID STIMULATING HORMONE (SENSITIVE TSH): TSH: 5.573 u[IU]/mL — ABNORMAL HIGH (ref 0.350–5.000)

## 2017-10-05 LAB — POCT ISTAT CG8 BLOOD GAS ELECTROLYTES HEMATOCRIT (RESULTS)
GLUCOSE, POC: 136 mg/dl — ABNORMAL HIGH (ref 70–105)
HCO3 (HCO3P): 20 mmol/L — ABNORMAL LOW (ref 22–26)
HEMATOCRIT, POC: 29 % — ABNORMAL LOW (ref 33.5–45.2)
IONIZED CALCIUM, POC: 1.08 mmol/L — ABNORMAL LOW (ref 1.30–1.46)
PCO2 (PCO2P): 41 mmHg (ref 35–45)
PH (PHP): 7.29 — ABNORMAL LOW (ref 7.35–7.45)
PH (PHP): 7.29 — ABNORMAL LOW (ref 7.35–7.45)
PO2 (PO2P): 401 mmHg — ABNORMAL HIGH (ref 72–100)
POTASSIUM, POC: 3.1 mmol/L — ABNORMAL LOW (ref 3.5–5.0)
SO2 (SO2P): 100 % (ref >=80)
SODIUM, POC: 142 mmol/L (ref 136–145)

## 2017-10-05 LAB — VENOUS BLOOD GAS/LACTATE
%FIO2 (VENOUS): 100 %
BICARBONATE (VENOUS): 19.5 mmol/L — ABNORMAL LOW (ref 22.0–26.0)
LACTATE: 2.9 mmol/L — ABNORMAL HIGH (ref 0.0–1.3)
PCO2 (VENOUS): 49 mm/Hg (ref 41.00–51.00)
PCO2 (VENOUS): 49 mm/Hg (ref 41.00–51.00)

## 2017-10-05 LAB — TYPE AND SCREEN
ABO/RH(D): A NEG
ANTIBODY SCREEN: NEGATIVE
UNITS ORDERED: 4

## 2017-10-05 LAB — TROPONIN-I (FOR ED ONLY): TROPONIN I: 43 ng/L (ref 0–30)

## 2017-10-05 LAB — BASIC METABOLIC PANEL
ANION GAP: 12 mmol/L (ref 4–13)
BUN/CREA RATIO: 19 (ref 6–22)
BUN: 26 mg/dL — ABNORMAL HIGH (ref 8–25)
CALCIUM: 9 mg/dL (ref 8.5–10.2)
CHLORIDE: 105 mmol/L (ref 96–111)
CO2 TOTAL: 22 mmol/L (ref 22–32)
CREATININE: 1.34 mg/dL — ABNORMAL HIGH (ref 0.49–1.10)
GLUCOSE: 161 mg/dL — ABNORMAL HIGH (ref 65–139)
SODIUM: 139 mmol/L (ref 136–145)

## 2017-10-05 LAB — CBC WITH DIFF
MCH: 30.6 pg (ref 26.0–32.0)
MCHC: 31.7 g/dL (ref 31.0–35.5)
MCV: 96.3 fL (ref 78.0–100.0)
MPV: 9.5 fL (ref 8.7–12.5)
PLATELETS: 263 10*3/uL (ref 150–400)
RBC: 4.09 10*6/uL (ref 3.85–5.22)
RDW-CV: 13.2 % (ref 11.5–15.5)

## 2017-10-05 LAB — MAGNESIUM: MAGNESIUM: 2.1 mg/dL (ref 1.6–2.6)

## 2017-10-05 LAB — HGA1C (HEMOGLOBIN A1C WITH EST AVG GLUCOSE)
ESTIMATED AVERAGE GLUCOSE: 123 mg/dL
HEMOGLOBIN A1C: 5.9 % (ref 4.0–6.0)
HEMOGLOBIN A1C: 5.9 % (ref 4.0–6.0)

## 2017-10-05 LAB — PTT (PARTIAL THROMBOPLASTIN TIME): APTT: 34 seconds (ref 24.1–38.5)

## 2017-10-05 LAB — PHOSPHORUS: PHOSPHORUS: 4.8 mg/dL — ABNORMAL HIGH (ref 2.3–4.0)

## 2017-10-05 LAB — ALT (SGPT): ALT (SGPT): 30 U/L (ref <55)

## 2017-10-05 LAB — AST (SGOT): AST (SGOT): 39 U/L (ref 8–41)

## 2017-10-05 SURGERY — HVI PROCEDURE CASE CANCEL
Site: Chest

## 2017-10-05 MED ORDER — MANNITOL 20 % INTRAVENOUS SOLUTION
INTRAVENOUS | Status: AC
Start: 2017-10-05 — End: 2017-10-05
  Filled 2017-10-05: qty 250

## 2017-10-05 MED ORDER — EPINEPHRINE 0.1 MG/ML INJECTION SYRINGE
INJECTION | INTRAMUSCULAR | Status: AC
Start: 2017-10-05 — End: 2017-10-06
  Filled 2017-10-05: qty 10

## 2017-10-05 MED ORDER — NOREPINEPHRINE 10 MCG/ML IV DILUTION
Freq: Once | INTRAVENOUS | Status: DC | PRN
Start: 2017-10-05 — End: 2017-10-05
  Administered 2017-10-05 (×3): 6.4 ug via INTRAVENOUS

## 2017-10-05 MED ORDER — ONDANSETRON HCL (PF) 4 MG/2 ML INJECTION SOLUTION
4.00 mg | Freq: Three times a day (TID) | INTRAMUSCULAR | Status: DC | PRN
Start: 2017-10-05 — End: 2017-10-06
  Filled 2017-10-05: qty 2

## 2017-10-05 MED ORDER — NOREPINEPHRINE 16MG IN NS 250ML INFUSION - FOR ANES
INTRAVENOUS | Status: DC | PRN
Start: 2017-10-05 — End: 2017-10-05
  Administered 2017-10-05: 0.08 ug/kg/min via INTRAVENOUS
  Administered 2017-10-05: 0.03 ug/kg/min via INTRAVENOUS
  Administered 2017-10-05: 0.05 ug/kg/min via INTRAVENOUS

## 2017-10-05 MED ORDER — MIDAZOLAM 1 MG/ML INJECTION SOLUTION
Freq: Once | INTRAMUSCULAR | Status: DC | PRN
Start: 2017-10-05 — End: 2017-10-05
  Administered 2017-10-05: 2 mg via INTRAVENOUS

## 2017-10-05 MED ORDER — SODIUM CHLORIDE 0.9 % (FLUSH) INJECTION SYRINGE
2.0000 mL | INJECTION | INTRAMUSCULAR | Status: DC | PRN
Start: 2017-10-05 — End: 2017-10-06

## 2017-10-05 MED ORDER — SODIUM CHLORIDE 0.9 % (FLUSH) INJECTION SYRINGE
2.0000 mL | INJECTION | Freq: Three times a day (TID) | INTRAMUSCULAR | Status: DC
Start: 2017-10-05 — End: 2017-10-06
  Administered 2017-10-05: 2 mL
  Administered 2017-10-05: 0 mL
  Administered 2017-10-06: 2 mL

## 2017-10-05 MED ORDER — ALBUMIN, HUMAN 5 % INTRAVENOUS SOLUTION
INTRAVENOUS | Status: DC | PRN
Start: 2017-10-05 — End: 2017-10-05

## 2017-10-05 MED ORDER — POTASSIUM CHLORIDE 2 MEQ/ML MICROPLEGIA SOLUTION
1.0000 | Freq: Once | INTRAVENOUS | Status: DC
Start: 2017-10-05 — End: 2017-10-05
  Filled 2017-10-05: qty 60

## 2017-10-05 MED ORDER — ONDANSETRON HCL (PF) 4 MG/2 ML INJECTION SOLUTION
INTRAMUSCULAR | Status: AC
Start: 2017-10-05 — End: 2017-10-05
  Administered 2017-10-05: 4 mg via INTRAVENOUS
  Filled 2017-10-05: qty 2

## 2017-10-05 MED ORDER — ESMOLOL 100 MG/10 ML (10 MG/ML) INTRAVENOUS SOLUTION
INTRAVENOUS | Status: DC
Start: 2017-10-05 — End: 2017-10-06
  Filled 2017-10-05: qty 10

## 2017-10-05 MED ORDER — ETOMIDATE 2 MG/ML INTRAVENOUS SYRINGE
INJECTION | Freq: Once | INTRAVENOUS | Status: DC | PRN
Start: 2017-10-05 — End: 2017-10-05
  Administered 2017-10-05: 10 mg via INTRAVENOUS

## 2017-10-05 MED ORDER — MORPHINE 2 MG/ML INTRAVENOUS SYRINGE
1.0000 mg | INJECTION | INTRAVENOUS | Status: DC
Start: 2017-10-06 — End: 2017-10-06
  Administered 2017-10-05 (×2): 1 mg via INTRAVENOUS
  Filled 2017-10-05 (×3): qty 1

## 2017-10-05 MED ORDER — MORPHINE 2 MG/ML INTRAVENOUS SYRINGE
INJECTION | INTRAVENOUS | Status: AC
Start: 2017-10-05 — End: 2017-10-05
  Administered 2017-10-05: 1 mg via INTRAVENOUS
  Filled 2017-10-05: qty 1

## 2017-10-05 MED ORDER — NOREPINEPHRINE BITARTRATE 16 MG/250 ML (64 MCG/ML) IN 0.9 % NACL IV
0.0100 ug/kg/min | INTRAVENOUS | Status: DC
Start: 2017-10-05 — End: 2017-10-06
  Administered 2017-10-05: 0.09 ug/kg/min via INTRAVENOUS
  Administered 2017-10-05: 0 ug/kg/min via INTRAVENOUS
  Administered 2017-10-05: 0.08 ug/kg/min via INTRAVENOUS

## 2017-10-05 MED ORDER — PHENYLEPHRINE 50 MG/250 ML (200 MCG/ML) IN 0.9 % SODIUM CHLORIDE IV
INTRAVENOUS | Status: AC
Start: 2017-10-05 — End: 2017-10-05
  Filled 2017-10-05: qty 250

## 2017-10-05 MED ORDER — LIDOCAINE (PF) 100 MG/5 ML (2 %) INTRAVENOUS SYRINGE
INJECTION | Freq: Once | INTRAVENOUS | Status: DC | PRN
Start: 2017-10-05 — End: 2017-10-05
  Administered 2017-10-05 (×2): 100 mg via INTRAVENOUS

## 2017-10-05 MED ORDER — SODIUM CHLORIDE 0.9 % INTRAVENOUS SOLUTION
INTRAVENOUS | Status: AC
Start: 2017-10-05 — End: 2017-10-05
  Filled 2017-10-05: qty 30

## 2017-10-05 MED ORDER — CALCIUM CHLORIDE 100 MG/ML (10 %) INTRAVENOUS SYRINGE
INJECTION | Freq: Once | INTRAVENOUS | Status: DC | PRN
Start: 2017-10-05 — End: 2017-10-05
  Administered 2017-10-05 (×5): 100 mg via INTRAVENOUS

## 2017-10-05 MED ORDER — VASOPRESSIN 20 UNITS IN NS 100 ML PREMIX INFUSION (SHOCK)
INTRAVENOUS | Status: DC | PRN
Start: 2017-10-05 — End: 2017-10-05
  Administered 2017-10-05: 0.04 [IU]/min via INTRAVENOUS

## 2017-10-05 MED ORDER — VASOPRESSIN 20 UNITS IN NS 100 ML PREMIX INFUSION (SHOCK)
0.0100 [IU]/min | INTRAVENOUS | Status: DC
Start: 2017-10-05 — End: 2017-10-06
  Administered 2017-10-05: 0 [IU]/min via INTRAVENOUS
  Administered 2017-10-05: 0.04 [IU]/min via INTRAVENOUS

## 2017-10-05 MED ORDER — MORPHINE 2 MG/ML INTRAVENOUS SYRINGE
2.0000 mg | INJECTION | INTRAVENOUS | Status: AC
Start: 2017-10-05 — End: 2017-10-05
  Administered 2017-10-05: 2 mg via INTRAVENOUS
  Filled 2017-10-05: qty 1

## 2017-10-05 MED ORDER — EPINEPHRINE 15MG IN NS 250ML INFUSION - FOR ANES
INTRAVENOUS | Status: DC | PRN
Start: 2017-10-05 — End: 2017-10-05
  Administered 2017-10-05: .02 ug/kg/min via INTRAVENOUS

## 2017-10-05 MED ORDER — ESMOLOL 2,000 MG/100 ML (20 MG/ML) IN SODIUM CHLORIDE (ISO-OSMOTIC) IV
INTRAVENOUS | Status: DC
Start: 2017-10-05 — End: 2017-10-06
  Filled 2017-10-05: qty 100

## 2017-10-05 MED ORDER — SODIUM BICARBONATE 1 MEQ/ML (8.4 %) INTRAVENOUS SOLUTION
1.0000 | Freq: Once | INTRAVENOUS | Status: DC
Start: 2017-10-05 — End: 2017-10-05
  Filled 2017-10-05: qty 3.35

## 2017-10-05 MED ORDER — MORPHINE 2 MG/ML INTRAVENOUS SYRINGE
1.0000 mg | INJECTION | INTRAVENOUS | Status: AC
Start: 2017-10-05 — End: 2017-10-05

## 2017-10-05 MED ORDER — SODIUM CHLORIDE 0.9 % IV BOLUS
250.00 mL | INJECTION | Status: AC
Start: 2017-10-05 — End: 2017-10-05
  Administered 2017-10-05: 16:00:00 250 mL via INTRAVENOUS

## 2017-10-05 MED ORDER — SODIUM BICARBONATE 1 MEQ/ML (8.4 %) INTRAVENOUS SOLUTION
1.0000 | Freq: Once | Status: DC
Start: 2017-10-05 — End: 2017-10-05
  Filled 2017-10-05: qty 35.2

## 2017-10-05 MED ORDER — ONDANSETRON HCL (PF) 4 MG/2 ML INJECTION SOLUTION
4.00 mg | INTRAMUSCULAR | Status: AC
Start: 2017-10-05 — End: 2017-10-05
  Administered 2017-10-05: 4 mg via INTRAVENOUS

## 2017-10-05 MED ORDER — SODIUM CHLORIDE 0.9 % (FLUSH) INJECTION SYRINGE
2.00 mL | INJECTION | Freq: Three times a day (TID) | INTRAMUSCULAR | Status: DC
Start: 2017-10-05 — End: 2017-10-06
  Administered 2017-10-05: 0 mL
  Administered 2017-10-05: 2 mL
  Administered 2017-10-06: 06:00:00 0 mL

## 2017-10-05 MED ORDER — FENTANYL (PF) 50 MCG/ML INJECTION SOLUTION
Freq: Once | INTRAMUSCULAR | Status: DC | PRN
Start: 2017-10-05 — End: 2017-10-05
  Administered 2017-10-05 (×2): 50 ug via INTRAVENOUS

## 2017-10-05 MED ORDER — SODIUM CHLORIDE 0.9 % IV BOLUS
40.00 mL | INJECTION | Freq: Once | Status: DC | PRN
Start: 2017-10-05 — End: 2017-10-05

## 2017-10-05 MED ORDER — PHENYLEPHRINE 1 MG/10 ML (100 MCG/ML) IN 0.9 % SOD.CHLORIDE IV SYRINGE
INJECTION | INTRAVENOUS | Status: DC
Start: 2017-10-05 — End: 2017-10-06
  Filled 2017-10-05: qty 20

## 2017-10-05 MED ORDER — SODIUM CHLORIDE 0.9 % (FLUSH) INJECTION SYRINGE
2.00 mL | INJECTION | INTRAMUSCULAR | Status: DC | PRN
Start: 2017-10-05 — End: 2017-10-06

## 2017-10-05 MED ORDER — SODIUM CHLORIDE 0.9 % IV BOLUS
250.00 mL | INJECTION | Status: AC
Start: 2017-10-05 — End: 2017-10-05
  Administered 2017-10-05: 0 mL via INTRAVENOUS

## 2017-10-05 MED ORDER — SODIUM CHLORIDE 0.9 % INTRAVENOUS SOLUTION
0.0100 ug/kg/min | INTRAVENOUS | Status: DC
Start: 2017-10-05 — End: 2017-10-06
  Administered 2017-10-05: 0.05 ug/kg/min via INTRAVENOUS
  Administered 2017-10-05: 0 ug/kg/min via INTRAVENOUS

## 2017-10-05 MED ORDER — DEXTROSE 5 % IN WATER (D5W) INTRAVENOUS SOLUTION
2.0000 g | Freq: Three times a day (TID) | INTRAVENOUS | Status: DC
Start: 2017-10-05 — End: 2017-10-05

## 2017-10-05 MED ORDER — SODIUM CHLORIDE 0.9 % INTRAVENOUS SOLUTION
INTRAVENOUS | Status: DC
Start: 2017-10-05 — End: 2017-10-06
  Administered 2017-10-05: 0 via INTRAVENOUS

## 2017-10-05 MED ADMIN — PEDS CUSTOM PARENTERAL NUTRITION - LESS THAN 6 MONTHS: INTRAVENOUS | @ 16:00:00 | NDC 00338113003

## 2017-10-05 MED ADMIN — lactated Ringers intravenous solution: INTRAVENOUS | @ 16:00:00 | NDC 00338011704

## 2017-10-05 MED ADMIN — norepinephrine bitartrate 1 mg/mL intravenous solution: INTRAVENOUS | @ 16:00:00

## 2017-10-05 MED ADMIN — sodium chloride 0.9 % (flush) injection syringe: INTRAVENOUS | @ 16:00:00

## 2017-10-05 MED ADMIN — sodium chloride 0.9 % (flush) injection syringe: INTRAVENOUS | @ 18:00:00

## 2017-10-05 SURGICAL SUPPLY — 78 items
APPLICATOR W/DUAL SPRAY TIP_11:1 RATIO MUST ORDER 10 (SEALANTS) ×1
CANNULA 14GA DLP 5.5IN ANTGRD RADOPQ STD TIP FLWGRD INTROD AOR ROOT PERF (CANNULA) ×3 IMPLANT
CANNULA DUAL MALLEABLE 16 X10_MIN ORDER 10 (CANNULA)
CANNULA PERF 20FR FEM ART (CANNULA) ×4 IMPLANT
CANNULA PERF AORTIC 7FR 5.5INL (CANNULA) ×1
CART HEPCON 5L HEP DS RSPN CAR_T SYRG BLUNT TIP NEEDLE (TEST) IMPLANT
CART HEPCON SILVER 2-3.5MG/KG_BLUNT HEP 4 CHNL SYRG NEEDLE (TEST) IMPLANT
CART HMS + HEP ASY 2 CHNL 9 SYRG HI RNG ACT CLOT TIME ANALYZER DISP (TEST) IMPLANT
CART HMS + HR-ACT HEP ASY 2 CH_NL 9 SYRG BLUNT NEEDLE (TEST)
CART HMS + RD .9- MG/KG 4 CHNL 9 SYRG HEP ASY BLUNT NEEDLE ANALYZER DISP (TEST) IMPLANT
CART HMS + RD 0-.9MG/KG HEP AS_Y 4 CHNL 9 SYRG BLUNT NEEDLE (TEST)
CART ISTAT HEMA CG8+ ANALYZER (REAGENT) IMPLANT
CONN PERF RDC .5IN 3/8IN .5IN STRL (Connecting Tubes/Misc) ×3 IMPLANT
CONNECTOR 3/8 X 1/2_050518000 (Connecting Tubes/Misc) ×1
CONV USE ITEM 338686 - PACK SURG CARDIAC NONST DISP LF (CUSTOM TRAYS & PACK) ×3 IMPLANT
CUVETTE MONITOR 3/8IN HS (PERFUSION/HEART SUPPLIES)
CUVETTE PERFUSION MONITOR H/S 3/8IN X 3/8IN DISPOSABLE (PERFUSION/HEART SUPPLIES) IMPLANT
DRAIN INCS 24FR JP SIL CHNL STRL LF  RND FLUTE DISP WHT (Drains/Resovoirs) ×6 IMPLANT
DRAIN WND RND 24FR JP2234_10/BX (Drains/Resovoirs) ×2
DUP USE ITEM 161800 - RESERVOIR AUTOTRANSFUSION 40 U_M COLLECTION FILTER (PERFUSION/HEART SUPPLIES) IMPLANT
FILTER BLOOD PALL LEUKOGUARD 6/CS LG6B (BLOOD) IMPLANT
FILTER CARDIOPLEGIA CPS02 (PERFUSION/HEART SUPPLIES) IMPLANT
FILTER O2 PRE BYPASS 1/4X1/4_029029000 25EA/CS (ANETHESIA SUPPLIES) ×1
FILTER PERF .25IN O2 STRL (ANETHESIA SUPPLIES) ×3 IMPLANT
GOWN SURG LRG AAMI L4 REINF HK_LP CLSR STRL LF DISP BLU (DGOW) ×2
GOWN SURG LRG L4 REINF HKLP CLSR SET IN SLEEVE STRL LF  DISP BLU SIRUS SMS PE 43IN (DGOW) ×6 IMPLANT
HEMOCONCR .07SQ MR HPH MINI LOW PRM VOL GNTL ULFLTR RATE PERF PLSLFN 15CM 2.5CM 14ML STRL (PERFUSION/HEART SUPPLIES) IMPLANT
HEMOCONCR .07SQ MR HPH MINI LO_W PRM VOL GNTL ULFLTR RT PERF (PERFUSION/HEART SUPPLIES)
INSERT L STLTH 90MM MESH WOVEN_5 CLAMP LF (INSTRUMENTS) ×1
INSERT STLTH 90MM LATIS 5 CLAMP STRL LF DISP (SURGICAL INSTRUMENTS) ×3 IMPLANT
KIT PACING MYOWR MYOCARDIUM VN_TRC WRE TMPRY (KITS & TRAYS (DISPOSABLE)) ×8 IMPLANT
KIT SUT COR-KNT MINI CMB STRL (MISCELLANEOUS PT CARE ITEMS) ×2
KIT SUT COR-KNT MINI COMBO STRL (MISCELLANEOUS PT CARE ITEMS) ×3 IMPLANT
LINE ISOLATION EA 1K62R1 PK/10 (IV TUBING & ACCESSORIES) IMPLANT
LINE TABLE CARDIOPLEGIA CSC14_CS/10 027532201 (IRR)
OXYGENATOR ID QUADROX BIOLINE (PERFUSION/HEART SUPPLIES) IMPLANT
OXYGENATOR PERF 1CXFX15E (PERFUSION/HEART SUPPLIES) IMPLANT
OXYGENATOR PERF 1CXFX25E (PERFUSION/HEART SUPPLIES) IMPLANT
PACK 1/2IN VENOUS PERF (PACK) IMPLANT
PACK 3/8IN VENOUS (PACK) IMPLANT
PACK ACCESSORY PERF_020843701 10/CS (PERFUSION/HEART SUPPLIES) IMPLANT
PACK CUSTOM CARDIAC (CUSTOM TRAYS & PACK) ×2
PACK INSPIRE 046006600_046006600 (PERFUSION/HEART SUPPLIES) IMPLANT
PACK PRC STRL DISP HARV SMRT_PRP 2 LF (KITS & TRAYS (DISPOSABLE)) IMPLANT
PACK SURG CARDIAC NONST DISP LF (CUSTOM TRAYS & PACK) ×3
PACK SURG PROC LF  STRL .25X.25IN PED (PERFUSION/HEART SUPPLIES) IMPLANT
PACK SURG PROC LF  STRL 3/8X3/8IN PED (PERFUSION/HEART SUPPLIES) IMPLANT
PACK TUBING ECMO CUSTOM (PACK) IMPLANT
PAD MNT ADH LEVEL (TEMP) IMPLANT
PAD MOUNTING LEVEL I 232741_CS/100 (TEMP)
PUMP HEAD REV SAT/CRIT_W/TUBING (PERFUSION/HEART SUPPLIES) IMPLANT
PUMP HEAD REVOLUTION W/TUBING (MISCELLANEOUS PT CARE ITEMS) IMPLANT
SENSOR CDI 500_20/CS CDI510H (INSTRUMENTS)
SENSOR SHUNT CDI HEP 1.2ML SYS 500 STRL DISP (SURGICAL INSTRUMENTS) IMPLANT
SET AUTOTRANS WASH CHAMBER TUB_E AT1 CATS (IV TUBING & ACCESSORIES) IMPLANT
SET AUTOTRANSFUSION TUBING ATS_SUCTION LINE (Cautery Accessories) IMPLANT
SET CARDIOPLEGIA DEL LINE 6FT (PERFUSION/HEART SUPPLIES)
SET CARDIOPLEGIA DEL LINE_MIN ORDER 10 (PERFUSION/HEART SUPPLIES) IMPLANT
SET CRDPLG 16 CONVOLUTION (PERFUSION/HEART SUPPLIES) IMPLANT
SET EXT 3/16IN 6FT LWVL LINE (PERFUSION/HEART SUPPLIES) IMPLANT
SET PERF HEART/LUNG 1/4 X 1/4_075103402 (PERFUSION/HEART SUPPLIES)
SET TBL LINE CARDIOPLGA (IRR) IMPLANT
SET TUBING PERF PEDS 3/8 X 3/8_W/BIOPUMP 046006300 1EA/CS (PERFUSION/HEART SUPPLIES)
SOL IRRG 0.9% NACL 1000ML PLASTIC PR BTL ISTNC N-PYRG STRL LF (SOLUTIONS) ×12 IMPLANT
SOL IV VIAFLEX PLAS-LYTE A PH 7.4 TY 1 1000ML LF (SOLUTIONS) IMPLANT
SOLUTION IRRG NS 2F7124 1000CC_12/CS (SOLUTIONS) ×4
SOLUTION IV ELECTROLYTES_1000ML 2B2544X CS/14 (SOLUTIONS)
STOPCOCK PERF 1 WY UNDIR PURGE LINE STRL (PERFUSION/HEART SUPPLIES) IMPLANT
STOPCOCK PERF 1 WY UNDIR PURGE_LINE STRL (PERFUSION/HEART SUPPLIES)
TIP APPL 10CM 16GA 2 CANN (CANNULA) IMPLANT
TIP APPL 2 SPRAY 11:1 (SEALANTS) ×3 IMPLANT
TUBING MONITORING PRESS 72IN_50-P172 (CUFF)
TUBING PERF 1/2X3/32X6FT_020466101 (PERFUSION/HEART SUPPLIES) IMPLANT
TUBING PERF 1/4X1/16X6FT_020463101 (PERFUSION/HEART SUPPLIES)
TUBING PERF 3/8X3/32X6FT_020465101 (PERFUSION/HEART SUPPLIES)
TUBING PERF PUMP .25INX1/16IN STD 72IN SEG STRL (PERFUSION/HEART SUPPLIES) IMPLANT
TUBING PERF PUMP 3/8INX3/32IN 72IN STD DRMTR 68 SHOR STRL (PERFUSION/HEART SUPPLIES) IMPLANT
TUBING PRESS MONITOR 72IN TRUWAVE MALE TO FEMALE CONN TRANSDUC STRL LF  DISP (CUFF) IMPLANT

## 2017-10-05 NOTE — Care Plan (Addendum)
Pt from OR intubated and placed on mechanical ventilation.   VENTILATOR - PRESSURE CONTROL CONTINUOUS Discontinue   Duration: Until Specified    Priority: Routine       Question Answer Comment   FIO2 (%) 40    Peep(cm/H2O) 5    Rate(bpm) 14    Pressure Control(cm/H2O) 9            RT waiting for further orders.  Will continue to monitor respiratory status.    Pt extubated to a 3 liter nasal cannula.

## 2017-10-05 NOTE — Progress Notes (Signed)
DNR NOTE    I had a discussion with the patient's husband and daughter at the bedside. They explained that the patient has had conversations with them in the past stating that she would not want to be on life support. The husband requested that if her heart were to stop that we do not do CPR.   Pt will be DNR - no CPR and do not re intubate as per the conversation with the patient's husband as per her wishes.    Seward CarolVeena Ailed Defibaugh, MD

## 2017-10-05 NOTE — Anesthesia Transfer of Care (Signed)
ANESTHESIA TRANSFER OF CARE   Imj Emergency is a 72 y.o. ,female, Weight: 68.4 kg (150 lb 12.7 oz)   had Procedure(s):  Case Cancelled In Or  performed  10/05/17   Primary Service:     No past medical history on file.   Allergy History as of 10/05/17      No Allergies on File              I completed my transfer of care / handoff to the receiving personnel during which we discussed:  Access, Airway, All key/critical aspects of case discussed, Analgesia, Expectation of post procedure, Fluids/Product, Gave opportunity for questions and acknowledgement of understanding, Labs and PMHx                                                                    Last OR Temp: Temperature: 37.2 C (99 F)  ABG:  PCO2 (PCO2P)   Date Value Ref Range Status   10/05/2017 41 35 - 45 mmHg Final     PCO2 (VENOUS)   Date Value Ref Range Status   10/05/2017 49.00 41.00 - 51.00 mm/Hg Final     PO2 (PO2P)   Date Value Ref Range Status   10/05/2017 401 (H) 72 - 100 mmHg Final     PO2 (VENOUS)   Date Value Ref Range Status   10/05/2017 24.0 (L) 35.0 - 50.0 mm/Hg Final     POTASSIUM   Date Value Ref Range Status   10/05/2017 3.6 3.5 - 5.1 mmol/L Final     POTASSIUM, POC   Date Value Ref Range Status   10/05/2017 3.1 (L) 3.5 - 5.0 mmol/L Final     CALCIUM   Date Value Ref Range Status   10/05/2017 9.0 8.5 - 10.2 mg/dL Final     IONIZED CALCIUM, POC   Date Value Ref Range Status   10/05/2017 1.08 (L) 1.30 - 1.46 mmol/L Final     LACTATE   Date Value Ref Range Status   10/05/2017 2.9 (H) 0.0 - 1.3 mmol/L Final     BASE EXCESS (BEP)   Date Value Ref Range Status   10/05/2017 -7.0 (L) -2.0 - 3.0 mEq/L Final     BASE DEFICIT   Date Value Ref Range Status   10/05/2017 4.7 (H) -3.0 - 3.0 mmol/L Final     HCO3 (HCO3P)   Date Value Ref Range Status   10/05/2017 20 (L) 22 - 26 mmol/L Final     BICARBONATE (VENOUS)   Date Value Ref Range Status   10/05/2017 19.5 (L) 22.0 - 26.0 mmol/L Final     %FIO2 (VENOUS)   Date Value Ref Range Status   10/05/2017 100.0  % Final     Airway:* No LDAs found *  Pulse 85, temperature 37.2 C (99 F), resp. rate 13, weight 68.4 kg (150 lb 12.7 oz), SpO2 100 %.

## 2017-10-05 NOTE — Anesthesia Postprocedure Evaluation (Signed)
Anesthesia Post Op Evaluation    Patient: Gloria Briggs  Procedure(s):  Case Cancelled In Or    Last Vitals:Temperature: 37.2 C (99 F) (10/05/17 1512)  Heart Rate: 82 (10/05/17 2100)  Respiratory Rate: (!) 22 (10/05/17 2100)  SpO2: 92 % (10/05/17 1945)    Patient location during evaluation: ICU   Post-procedure handoff checklist completed    Anesthetic complications: no        Patient transported from the OR intubated after case cancellation. Plan for no escalation of care.

## 2017-10-05 NOTE — ED Attending Note (Signed)
I saw and examined the patient.  I directly supervised the patient's care.  I discussed the patient's care with the resident/APP and I agree with the findings, diagnosis, and plan as documented in the primary note.  Any exceptions, additions, or corrections are noted below.    MDM - re-evaluated pt, reviewed old records, reviewed labs, reviewed imaging, IV therapy was given, reviewed and interpreted ECGs, discussed care with consulting service, hemo monitoring    Chest pain was the initial EMS call, became hypotensive en route, initially responded to push dose epinephrine  Recurrent hypotension on arrival with decreased level of consciousness  Obvious type A aortic dissection on bedside US with moderate pericardial effusion, normal appearing LV systolic function  Cardiac surgery called upon recognizing findings of acute type A aortic dissection  Supportive care  Airway patent, patient somnolent, but arousable and able to answer simple questions and follow simple commands - avoided RSI until better resuscitated, pulse ox 100%   BP not obtainable, but patient mentating  When invasive BP obtained, 60s systolic - after which gentle fluid boluses, considered norepi, but taken to OR before started  Did not aggressively resuscitate to prevent extension of dissection  Considered HR control, held until invasive BP established, cardiac surgery rec against B-blockers due to hypotension    Procedures: bedside US, R femoral central venous access, L femoral arterial access    CRITICAL CARE: Patient presented with/developed acute aortic dissection. I spent 68 minutes while the patient was in this condition providing evaluation, supportive care, patient and family education, coordination with nursing and other staff. My care also included initial evaluation and stabilization, review of data, re-examination, discussion with admitting and consulting services to arrange definitive care, and was exclusive of any procedures performed.  In addition, I reviewed the resident's documentation and agree with the assessment and plan of care with corrections, additions and/or clarifications as noted.

## 2017-10-05 NOTE — Nurses Notes (Signed)
Patient arrived to CVICU 7 from the OR s/p cancelled case. Report received form anesthesia. Family at bedside discussing plan of care. Patient currently intubated. Epi, levo, and vaso gtts infusing.

## 2017-10-05 NOTE — Nurses Notes (Signed)
RT at bedside. Patient extubated compassionately per patient's requests, to 4L nasal canula, O2 is currently at 98%. Breath sounds bilateral. Plans to move patient to [facility] in the near future per family.

## 2017-10-05 NOTE — Anesthesia OR-ICU Handoff (Signed)
Anesthesia ICU Transfer of Care  Imj Emergency is a 72 y.o. ,female, Weight: 68.4 kg (150 lb 12.7 oz)   had Procedure(s):  Case Cancelled In Or  performed  10/05/17   Primary Service:     No past medical history on file.   Allergy History as of 10/05/17      No Allergies on File              I completed my ICU transfer of care/ Handoff to the ICU receiving personnel during which we discussed :  Access, Airway, All key and critical aspects of case discussed, Analgesia, Antibiotics, Expectation of post procedure, Fluids/Product, Gave opportunity for questions and acknowledgement of understanding, Labs and PMHx      Anesthesia Managment: This is for handoff only . For doses and times see [facility]      Paralytic:succinylcholine   Rhythm:sinus rhythm       Analgesia: fentanyl and midazolam            albumin    Airway: 1 attempt     Lines:A-line and Central line Other: Calcium Chloride Infusions : Vasopressin, Levophed and Epinephrine                        Last OR Temp: Temperature: 37.2 C (99 F)  ABG:  PCO2 (PCO2P)   Date Value Ref Range Status   10/05/2017 41 35 - 45 mmHg Final     PCO2 (VENOUS)   Date Value Ref Range Status   10/05/2017 49.00 41.00 - 51.00 mm/Hg Final     PO2 (PO2P)   Date Value Ref Range Status   10/05/2017 401 (H) 72 - 100 mmHg Final     PO2 (VENOUS)   Date Value Ref Range Status   10/05/2017 24.0 (L) 35.0 - 50.0 mm/Hg Final     POTASSIUM   Date Value Ref Range Status   10/05/2017 3.6 3.5 - 5.1 mmol/L Final     POTASSIUM, POC   Date Value Ref Range Status   10/05/2017 3.1 (L) 3.5 - 5.0 mmol/L Final     CALCIUM   Date Value Ref Range Status   10/05/2017 9.0 8.5 - 10.2 mg/dL Final     IONIZED CALCIUM, POC   Date Value Ref Range Status   10/05/2017 1.08 (L) 1.30 - 1.46 mmol/L Final     LACTATE   Date Value Ref Range Status   10/05/2017 2.9 (H) 0.0 - 1.3 mmol/L Final     BASE EXCESS (BEP)   Date Value Ref Range Status   10/05/2017 -7.0 (L) -2.0 - 3.0 mEq/L Final     BASE DEFICIT   Date Value Ref Range  Status   10/05/2017 4.7 (H) -3.0 - 3.0 mmol/L Final     HCO3 (HCO3P)   Date Value Ref Range Status   10/05/2017 20 (L) 22 - 26 mmol/L Final     BICARBONATE (VENOUS)   Date Value Ref Range Status   10/05/2017 19.5 (L) 22.0 - 26.0 mmol/L Final     %FIO2 (VENOUS)   Date Value Ref Range Status   10/05/2017 100.0 % Final     Airway:* No LDAs found *

## 2017-10-05 NOTE — Care Management Notes (Addendum)
[facility]  Care [facility] Note    Patient Name: Gloria Briggs  Date of Birth: 09/26/1945  Sex: female  Date/Time of Admission: 10/05/2017  3:18 PM  Room/Bed: 07/A  Payor: /    LOS: 0 days   Primary Care Providers:  Pcp, No (General)    Admitting Diagnosis:  No admission diagnoses are documented for this encounter.     10/05/17 1701   ADVANCE DIRECTIVES   Does the Patient have an Advance Directive? Yes, Patient Does Have Advance Directive for Healthcare Treatment   Document the Substance of the Advance Directive (Required) HCS   Type of Advance Directive Completed 1   Copy of Advance Directives in Chart? 0   Name of MPOA or Healthcare Surrogate Tammy Marks   Phone Number of MPOA or Healthcare Surrogate [phone] (H); [phone] (C)--per HCS no cell service where she lives.   Patient Requests Assistance in Having Advance Directive Notarized. N/A   Spoke w/husband and daughter in ED re: completion of ADV DIR; both states pt has not done so; HCS document completed w/appointing pt's daughter Si Raiderammy Marks as HCS as listed above; no family objections--please see scanned document; HCS document tubed to [facility].  Case Manager: Alden HippVickie S Jaquisha Frech, RN  Phone: [phone]

## 2017-10-05 NOTE — Anesthesia Preprocedure Evaluation (Signed)
ANESTHESIA PRE-OP EVALUATION  Planned Procedure: Case Cancelled In Or  Review of Systems     Physical Assessment      Airway                 No endotracheal tube present  No Tracheostomy present    Dental                    Pulmonary           Cardiovascular             Other findings            Plan  Planned anesthesia type: GETA    ASA 5 - emergent     Intravenous induction   Arterial line, Central line and CVP               NPO Status: Full stomach precautions.         Plan discussed with CRNA.             STAT CASE   Direct from ED to OR  Type A dissection unstable hemodynamics.   Waxing/waning mental status  Limited Hx

## 2017-10-05 NOTE — ED Provider Notes (Signed)
Department of Emergency Medicine        History & Physical         Name: Gloria Briggs  Age and Gender: 72 y.o. female  Date of Birth: [birth year]  Date of Service: 10/05/2017  MRN: [id number]  PCP: No Pcp      CC:   chest pain    HPI:  Gloria Briggs is a 72 y.o. female who presents with   Concern for chest pain was the initial call to EMS who found on scene she had some left-sided weakness patient was initially stroke page but this was changed prior to arrival. Sudden onset chest pain . Hypotension enroute       ROS:   unable to obtain secondary to patient's critical condition    Medications:  Medications Prior to Admission     None          Allergies:  unable to obtain secondary to patient's critical condition      Past Medical History  unable to obtain secondary to patient's critical condition        Past Surgical History   unable to obtain secondary to patient's critical condition    Social History     Socioeconomic History   . Marital status: Married     Spouse name: Not on file   . Number of children: Not on file   . Years of education: Not on file   . Highest education level: Not on file   Occupational History   . Not on file   Social Needs   . Financial resource strain: Not on file   . Food insecurity:     Worry: Not on file     Inability: Not on file   . Transportation needs:     Medical: Not on file     Non-medical: Not on file   Tobacco Use   . Smoking status: Not on file   Substance and Sexual Activity   . Alcohol use: Not on file   . Drug use: Not on file   . Sexual activity: Not on file   Lifestyle   . Physical activity:     Days per week: Not on file     Minutes per session: Not on file   . Stress: Not on file   Relationships   . Social connections:     Talks on phone: Not on file     Gets together: Not on file     Attends religious service: Not on file     Active member of club or organization: Not on file     Attends meetings of clubs or organizations: Not on file     Relationship status: Not  on file   . Intimate partner violence:     Fear of current or ex partner: Not on file     Emotionally abused: Not on file     Physically abused: Not on file     Forced sexual activity: Not on file   Other Topics Concern   . Not on file   Social History Narrative   . Not on file         Family Medical History:     None            Physical Exam:  All nurse's notes reviewed.  ED Triage Vitals [10/05/17 1512]   Enc Vitals Group      BP       Heart Rate (!) 104  Respiratory Rate (!) 29      Temperature 37.2 C (99 F)      Temp src       SpO2 100 %      Weight 68.4 kg (150 lb 12.7 oz)      Height       Head Circumference       Peak Flow       Pain Score       Pain Loc       Pain Edu?       Excl. in GC?      Constitutional:  Acute distress  HENT:   Head: Normocephalic and atraumatic.   Mouth/Throat: Oropharynx is clear and moist.   Eyes: PERRL, EOMI,   Neck: Trachea midline.   Cardiovascular: difficult ascultation   Pulmonary/Chest: BS equal bilaterally,   Abdominal: Abdomen soft          Musculoskeletal: No edema  Skin: warm and dry.   Neurological:   3=To Voice (Opens Eyes When Asked in Loud Voice)  6=Normal (Follows Simple Commands)  5=Normal Conversation  14    Labs:  Results for orders placed or performed during the hospital encounter of 10/05/17 (from the past 24 hour(s))   CBC/DIFF    Narrative    The following orders were created for panel order CBC/DIFF.  Procedure                               Abnormality         Status                     ---------                               -----------         ------                     CBC WITH YNWG[[id number][[id number]]                Abnormal            Final result               PATH COMMENT[[id number]]                                                                  Please view results for these tests on the individual orders.   BASIC METABOLIC PANEL, NON-FASTING   Result Value Ref Range    SODIUM 139 136 - 145 mmol/L    POTASSIUM 3.6 3.5 - 5.1 mmol/L    CHLORIDE 105 96 - 111  mmol/L    CO2 TOTAL 22 22 - 32 mmol/L    ANION GAP 12 4 - 13 mmol/L    CALCIUM 9.0 8.5 - 10.2 mg/dL    GLUCOSE 578161 (H) 65 - 139 mg/dL    BUN 26 (H) 8 - 25 mg/dL    CREATININE 4.691.34 (H) 0.49 - 1.10 mg/dL    BUN/CREA RATIO 19 6 - 22    ESTIMATED GFR 39 (L) >59 mL/min/1.2495m^2   PT/INR   Result Value  Ref Range    PROTHROMBIN TIME 11.4 9.5 - 14.1 seconds    INR 0.97 0.80 - 1.20    Narrative    Coumadin therapy INR range for Conventional Anticoagulation is 2.0 to 3.0 and for Intensive Anticoagulation 2.5 to 3.5.   PTT (PARTIAL THROMBOPLASTIN TIME)   Result Value Ref Range    APTT 34.0 24.1 - 38.5 seconds    Narrative    Therapeutic range for unfractionated heparin is 60-100 seconds.   TROPONIN-I (FOR ED ONLY)   Result Value Ref Range    TROPONIN I 43 (HH) 0 - 30 ng/L   CBC WITH DIFF   Result Value Ref Range    WBC 15.7 (H) 3.7 - 11.0 x10^3/uL    RBC 4.09 3.85 - 5.22 x10^6/uL    HGB 12.5 11.5 - 16.0 g/dL    HCT 16.1 09.6 - 04.5 %    MCV 96.3 78.0 - 100.0 fL    MCH 30.6 26.0 - 32.0 pg    MCHC 31.7 31.0 - 35.5 g/dL    RDW-CV 40.9 81.1 - 91.4 %    PLATELETS 263 150 - 400 x10^3/uL    MPV 9.5 8.7 - 12.5 fL   VENOUS BLOOD GAS/LACTATE   Result Value Ref Range    %FIO2 (VENOUS) 100.0 %    PH (VENOUS) 7.27 (L) 7.31 - 7.41    PCO2 (VENOUS) 49.00 41.00 - 51.00 mm/Hg    PO2 (VENOUS) 24.0 (L) 35.0 - 50.0 mm/Hg    BASE DEFICIT 4.7 (H) -3.0 - 3.0 mmol/L    BICARBONATE (VENOUS) 19.5 (L) 22.0 - 26.0 mmol/L    LACTATE 2.9 (H) 0.0 - 1.3 mmol/L   MAGNESIUM   Result Value Ref Range    MAGNESIUM 2.1 1.6 - 2.6 mg/dL   PHOSPHORUS   Result Value Ref Range    PHOSPHORUS 4.8 (H) 2.3 - 4.0 mg/dL   ALT (SGPT)   Result Value Ref Range    ALT (SGPT) 30 <55 U/L   AST (SGOT)   Result Value Ref Range    AST (SGOT) 39 8 - 41 U/L   LIPID PANEL   Result Value Ref Range    CHOLESTEROL 166 <200 mg/dL    HDL CHOL 48 (L) >=78 mg/dL    TRIGLYCERIDES 295 <=621 mg/dL    LDL CALC 89 <=308 mg/dL    VLDL CALC 29 <65 mg/dL    NON-HDL 784 <=696 mg/dL    CHOL/HDL RATIO 3.5     THYROID STIMULATING HORMONE (SENSITIVE TSH)   Result Value Ref Range    TSH 5.573 (H) 0.350 - 5.000 uIU/mL   MANUAL DIFF AND MORPHOLOGY-SYSMEX   Result Value Ref Range    NEUTROPHIL % 58 %    LYMPHOCYTE %  37 %    MONOCYTE % 5 %    EOSINOPHIL % 0 %    BASOPHIL % 0 %    NEUTROPHIL # 9.11 (H) 1.50 - 7.70 x10^3/uL    LYMPHOCYTE # 5.81 (H) 1.00 - 4.80 x10^3/uL    MONOCYTE # 0.79 0.20 - 1.10 x10^3/uL    EOSINOPHIL # <0.10 <=0.50 x10^3/uL    BASOPHIL # <0.10 <=0.20 x10^3/uL    NRBC FROM MANUAL DIFF 0 per 100 WBC    RBC MORPHOLOGY Normal RBC and PLT Morphology    TYPE AND SCREEN   Result Value Ref Range    UNITS ORDERED 4     ABO/RH(D) A NEGATIVE     ANTIBODY SCREEN NEGATIVE  SPECIMEN EXPIRATION DATE 10/08/2017    CROSSMATCH RED CELLS - UNITS   Result Value Ref Range    Coding System ISBT128     UNIT NUMBER [id number]     BLOOD COMPONENT TYPE LR RBC, Adsol3, 04730     UNIT DIVISION 00     UNIT DISPENSE STATUS RELEASED FROM ALLOCATION     TRANSFUSION STATUS OK TO TRANSFUSE     IS CROSSMATCH COMPATIBLE     Product Code E0382V00     Coding System ISBT128     UNIT NUMBER [id number]     BLOOD COMPONENT TYPE LR RBC, Adsol3, 04730     UNIT DIVISION 00     UNIT DISPENSE STATUS RELEASED FROM ALLOCATION     TRANSFUSION STATUS OK TO TRANSFUSE     IS CROSSMATCH COMPATIBLE     Product Code E0382V00     Coding System ISBT128     UNIT NUMBER [id number]     BLOOD COMPONENT TYPE LR RBC, Adsol3, 04730     UNIT DIVISION 00     UNIT DISPENSE STATUS RELEASED FROM ALLOCATION     TRANSFUSION STATUS OK TO TRANSFUSE     IS CROSSMATCH COMPATIBLE     Product Code B2841L24     Coding System ISBT128     UNIT NUMBER [id number]     BLOOD COMPONENT TYPE LR RBC, Adsol3, 04730     UNIT DIVISION 00     UNIT DISPENSE STATUS RELEASED FROM ALLOCATION     TRANSFUSION STATUS OK TO TRANSFUSE     IS CROSSMATCH COMPATIBLE     Product Code I3474Q59    POCT ISTAT CG8 BLOOD GAS ELECTROLYTES HEMATOCRIT (RESULTS)   Result Value Ref Range    SODIUM, POC  142 136 - 145 mmol/L    POTASSIUM, POC 3.1 (L) 3.5 - 5.0 mmol/L    IONIZED CALCIUM, POC 1.08 (L) 1.30 - 1.46 mmol/L    GLUCOSE, POC 136 (H) 70 - 105 mg/dl    HEMATOCRIT, POC 56.3 (L) 33.5 - 45.2 %    HEMOGLOBIN, POC 9.9 (L) 11.2 - 15.2 g/dL    PH (PHP) 8.75 (L) 6.43 - 7.45    PCO2 (PCO2P) 41 35 - 45 mmHg    PO2 (PO2P) 401 (H) 72 - 100 mmHg    TCO2 (TOC2P) 21 (L) 22 - 32 mmol/L    HCO3 (HCO3P) 20 (L) 22 - 26 mmol/L    BASE EXCESS (BEP) -7.0 (L) -2.0 - 3.0 mEq/L    SO2 (SO2P) 100 >=80 %       Imaging:  Results for orders placed or performed during the hospital encounter of 10/05/17 (from the past 72 hour(s))   XR AP MOBILE CHEST     Status: None    Narrative    [facility]  Female, 72 years old.    XR AP MOBILE CHEST performed on 10/05/2017 3:32 PM.    REASON FOR EXAM:  Chest Pain    TECHNIQUE: 1 views/2 images submitted for interpretation.    COMPARISON:  None      Impression    Cardiomediastinal silhouette is prominent. There is left base haziness  which may represent some atelectasis. There is likely underlying COPD. No  pulmonary edema or definite pneumonia seen.           Orders Placed This Encounter   . CANCELED: URINE CULTURE,ROUTINE   . XR AP MOBILE CHEST   . ED LIMITED ECHO ULTRASOUND   . CBC/DIFF   .  BASIC METABOLIC PANEL, NON-FASTING   . PT/INR   . PTT (PARTIAL THROMBOPLASTIN TIME)   . TROPONIN-I (FOR ED ONLY)   . CBC WITH DIFF   . VENOUS BLOOD GAS/LACTATE   . CANCELED: ELECTROLYTES   . BUN   . CANCELED: CREATININE   . MAGNESIUM   . PHOSPHORUS   . ALT (SGPT)   . AST (SGOT)   . HGA1C (HEMOGLOBIN A1C WITH EST AVG GLUCOSE)   . CANCELED: PT/INR   . LIPID PANEL   . THYROID STIMULATING HORMONE (SENSITIVE TSH)   . CANCELED: URINALYSIS, MACROSCOPIC AND MICROSCOPIC   . CANCELED: URINALYSIS, MACROSCOPIC   . CANCELED: URINALYSIS, MICROSCOPIC   . MANUAL DIFF AND MORPHOLOGY-SYSMEX   . PATH COMMENT   . CANCELED: INCENTIVE SPIROMETRY - RT INSTRUCT   . CANCELED: OXYGEN - NASAL CANNULA   . CANCELED: VENTILATOR WEANING  GUIDELINES   . CANCELED: WEANING PARAMETERS   . CANCELED: OXYGEN WEANING PARAMETERS   . CANCELED: INCENTIVE SPIROMETRY - RT INSTRUCT   . CANCELED: AIRWAY CLEARANCE - ACAPELLA   . CANCELED: VENTILATOR - PRESSURE CONTROL   . EXTUBATION   . CANCELED: INCENTIVE SPIROMETRY - RT INSTRUCT   . OXYGEN - NASAL CANNULA   . ECG 12-LEAD   . CANCELED: IP CONSULT CARDIAC REHAB PHASE I   . CANCELED: POCT WHOLE BLOOD GLUCOSE   . POCT WHOLE BLOOD GLUCOSE - ON ADMISSION   . TRANSESOPHAGEAL ECHO -- INTRAOP (ANES OR PEDS CARD ONLY)   . TYPE AND SCREEN   . CANCELED: TYPE AND CROSS RED CELLS - UNITS NON IRRADIATED, 4 Units   . CROSSMATCH RED CELLS - UNITS   . CANCELED: TYPE AND CROSS RED CELLS - UNITS NON IRRADIATED, 4 Units   . CROSSMATCH RED CELLS - UNITS NON-IRRADIATED, 4 Units   . INSERT & MAINTAIN PERIPHERAL IV ACCESS   . INSERT & MAINTAIN PERIPHERAL IV ACCESS   . PATIENT CLASS/LEVEL OF CARE DESIGNATION   . AND Linked Order Group    . NS flush syringe    . NS flush syringe   . esmolol (BREVIBLOC) 100 mg/10 mL (10 mg/mL) injection ---[employer]   . esmolol in NS (BREVIBLOC) 2,000 mg/100 mL premix infusion ---[employer]   . Phenylephrine HCl 1 mg/10 mL (100 mcg/mL) IV dilution ---Cabinet Override   . ondansetron (ZOFRAN) 2 mg/mL injection   . ondansetron (ZOFRAN) 2 mg/mL injection ---Cabinet Override   . NS bolus infusion 250 mL   . NS bolus infusion 250 mL   . AND Linked Order Group    . NS flush syringe    . NS flush syringe   . EPINEPHrine (ADRENALIN) 15 mg in NS 250 mL infusion   . vasopressin (VASOSTRICT) 20 units in NS 100 mL (0.2 units/mL) premix infusion (SHOCK)   . norepinephrine (LEVOPHED) 16 mg in NS premix infusion   . NS premix infusion   . ondansetron (ZOFRAN) 2 mg/mL injection   . morphine 2 mg/mL injection   . morphine 2 mg/mL injection   . morphine 2 mg/mL injection ---Cabinet Override       Abnormal Lab results:  Labs Reviewed   BASIC METABOLIC PANEL - Abnormal; Notable for the following components:         Result Value    GLUCOSE 161 (*)     BUN 26 (*)     CREATININE 1.34 (*)     ESTIMATED GFR 39 (*)     All other components within normal  limits   TROPONIN-I (FOR ED ONLY) - Abnormal; Notable for the following components:    TROPONIN I 43 (*)     All other components within normal limits   VENOUS BLOOD GAS/LACTATE - Abnormal; Notable for the following components:    PH (VENOUS) 7.27 (*)     PO2 (VENOUS) 24.0 (*)     BASE DEFICIT 4.7 (*)     BICARBONATE (VENOUS) 19.5 (*)     LACTATE 2.9 (*)     All other components within normal limits   PT/INR - Normal    Narrative:     Coumadin therapy INR range for Conventional Anticoagulation is 2.0 to 3.0 and for Intensive Anticoagulation 2.5 to 3.5.   PTT (PARTIAL THROMBOPLASTIN TIME) - Normal    Narrative:     Therapeutic range for unfractionated heparin is 60-100 seconds.   BUN   HGA1C (HEMOGLOBIN A1C WITH EST AVG GLUCOSE)       EKG: Reviewed  Plan: Appropriate labs and imaging ordered. Medical Records reviewed.    Therapy/Procedures/Course/MDM:    patient presenting to the emergency department with chest pain.  Bedside ultrasound immediately upon patient arrival remarkable for aortic dissection with dissection flap visualized.  Patient subsequently had right femoral central line and arterial lines placed.  CT surgery was emergently consulted and in the emergency department for consultation.   Patient was given IV fluids to support her blood pressure she continued to mentate throughout her emergency department treatment course though she was hypotensive.  Patient was taken to surgery with CT surgery   From the emergency department   At the conclusion of her emergency department treatment course.  CT surgery is discussing patient's condition with family.      Consults:   Cardiac surgery    Diagnosis / Impression:    aortic dissection    Disposition:    to OR    Lynnda Child, M.D. 10/05/2017  PGY-3 - Department of Emergency Medicine   [facility] of  Medicine

## 2017-10-05 NOTE — ED Nurses Note (Signed)
CT surgery at bedside. Per bedside echo probable aortic dissection.

## 2017-10-05 NOTE — ED Nurses Note (Signed)
PT transported to OR with CT surgery.

## 2017-10-05 NOTE — Nurses Notes (Signed)
Family came to nurse's station and asked nurse to come to bedside.  Patient's gaze was fixed to the right.  Patient's daughter stated she had just vomited and it came out through her nose as well.  Emesis basin had undigested food and dark purple colored liquid in it.  Stevie KernMegan Eckleberry ACNP called to bedside.  Patient's family educated on choices for further care.  Patient's family decided to keep the patient comfortable.  Awaiting orders.

## 2017-10-05 NOTE — Anesthesia Procedure Notes (Signed)
Arterial Line Procedure    Pt location: In OR    Anesthesia (see MAR for exact dosages):   A 20 G Catheter type: Arrow in length,  Placed on the right  radial artery  using Placement technique: ultrasound-guided. With  number of attempts:1.Secured with: transparent dressing   MEDICATIONS:     Post-procedure details:    Patient tolerance of procedure:  Tolerated with difficulty Flush/aspirate well  Complications:none  Performed By:  Performing provider: Sherren KernsHayanga, Calah Gershman, MD Authorizing provider: Arman FilterSloyer, Daniel Aaron, MD

## 2017-10-05 NOTE — H&P (Signed)
[facility]  CARDIAC SURGERY DEPARTMENTADMISSION     History and Physical    Date of Service:  10/05/2017  Gloria OchsJester, Attie  Encounter Start Date:  10/05/2017  Inpatient Admission Date:  10/05/2017  Admission Source: ED    Chief Complaint: Chest pain.    Primary Service: SCT  Consult Requested By: Emergency Medicine  Primary Care Provider: UNKNOWN  Primary Cardiologist: UNKNOWN    HPI: Gloria OchsDorothy Briggs is a 72 y.o., White female who presents s/p chest pain @ [employer]. Altered mental status. Epinephrine for hypotension. [facility]. On arrival, dopplarable femoral pulses. ED MD used ultrasound to find type A aortic dissection. SCT consulted emergently to ED. Dr. Anette RiedelWei and Dr. Su Hiltoberts spoke directly with family, decision was made to abort surgery and pursue comfort measures due to risks of surgery, including mortality.    ROS: Review of systems was not obtained due to altered mental status .      Information Obtained from spouse and daughter    No past medical history on file.      Risk Factors:    Unknown    Cardiac Status:    Unknown    Allergies not on file - UNKNOWN    Prior to Admission Medications:  Medications Prior to Admission     None           Current Inpatient Medications:    Current Facility-Administered Medications:  EPINEPHrine (ADRENALIN) 15 mg in NS 250 mL infusion 0.01 mcg/kg/min Intravenous Continuous   esmolol (BREVIBLOC) 100 mg/10 mL (10 mg/mL) injection ---Cabinet Override      esmolol in NS (BREVIBLOC) 2,000 mg/100 mL premix infusion ---Cabinet Override      norepinephrine (LEVOPHED) 16 mg in NS 250mL premix infusion 0.01 mcg/kg/min Intravenous Continuous   NS flush syringe 2 mL Intracatheter Q8HRS   And      NS flush syringe 2-6 mL Intracatheter Q1 MIN PRN   NS flush syringe 2 mL Intracatheter Q8HRS   And      NS flush syringe 2-6 mL Intracatheter Q1 MIN PRN   NS premix infusion  Intravenous Continuous   ondansetron (ZOFRAN) 2 mg/mL injection 4 mg Intravenous Q8H PRN     Phenylephrine HCl 1 mg/10 mL (100 mcg/mL) IV dilution ---Cabinet Override      vasopressin (VASOSTRICT) 20 units in NS 100 mL (0.2 units/mL) premix infusion (SHOCK) 0.01 Units/min Intravenous Continuous       Past Family History:   Family Medical History:     None            Social History     Socioeconomic History   . Marital status: Married     Spouse name: Not on file   . Number of children: Not on file   . Years of education: Not on file   . Highest education level: Not on file         Exam:   Temperature: 37.2 C (99 F)  Heart Rate: 85  Respiratory Rate: (!) 24  SpO2: 92 %  Constitutional:  acutely ill and mild distress  Eyes:  Pupils equal and round, reactive to light and accomodation.   Respiratory:  Clear to auscultation bilaterally. , intubated  Cardiovascular:  regular rate and rhythm  Gastrointestinal:  Soft, non-tender, non-distended  Integumentary:  skin cool, generalized mild mottling  Neurologic:  minimally responsive, opens eyes to voice, does not follow commands    Labs:  Lab Results Today:  Results for orders placed or performed during the hospital encounter of 10/05/17 (from the past 24 hour(s))   BASIC METABOLIC PANEL, NON-FASTING   Result Value Ref Range    SODIUM 139 136 - 145 mmol/L    POTASSIUM 3.6 3.5 - 5.1 mmol/L    CHLORIDE 105 96 - 111 mmol/L    CO2 TOTAL 22 22 - 32 mmol/L    ANION GAP 12 4 - 13 mmol/L    CALCIUM 9.0 8.5 - 10.2 mg/dL    GLUCOSE 161 (H) 65 - 139 mg/dL    BUN 26 (H) 8 - 25 mg/dL    CREATININE 0.96 (H) 0.49 - 1.10 mg/dL    BUN/CREA RATIO 19 6 - 22    ESTIMATED GFR 39 (L) >59 mL/min/1.58m^2   PT/INR   Result Value Ref Range    PROTHROMBIN TIME 11.4 9.5 - 14.1 seconds    INR 0.97 0.80 - 1.20   PTT (PARTIAL THROMBOPLASTIN TIME)   Result Value Ref Range    APTT 34.0 24.1 - 38.5 seconds   TROPONIN-I (FOR ED ONLY)   Result Value Ref Range    TROPONIN I 43 (HH) 0 - 30 ng/L   CBC WITH DIFF   Result Value Ref Range    WBC 15.7 (H) 3.7 - 11.0 x10^3/uL    RBC 4.09 3.85 - 5.22  x10^6/uL    HGB 12.5 11.5 - 16.0 g/dL    HCT 04.5 40.9 - 81.1 %    MCV 96.3 78.0 - 100.0 fL    MCH 30.6 26.0 - 32.0 pg    MCHC 31.7 31.0 - 35.5 g/dL    RDW-CV 91.4 78.2 - 95.6 %    PLATELETS 263 150 - 400 x10^3/uL    MPV 9.5 8.7 - 12.5 fL   MAGNESIUM   Result Value Ref Range    MAGNESIUM 2.1 1.6 - 2.6 mg/dL   PHOSPHORUS   Result Value Ref Range    PHOSPHORUS 4.8 (H) 2.3 - 4.0 mg/dL   ALT (SGPT)   Result Value Ref Range    ALT (SGPT) 30 <55 U/L   AST (SGOT)   Result Value Ref Range    AST (SGOT) 39 8 - 41 U/L   LIPID PANEL   Result Value Ref Range    CHOLESTEROL 166 <200 mg/dL    HDL CHOL 48 (L) >=21 mg/dL    TRIGLYCERIDES 308 <=657 mg/dL    LDL CALC 89 <=846 mg/dL    VLDL CALC 29 <96 mg/dL    NON-HDL 295 <=284 mg/dL    CHOL/HDL RATIO 3.5    THYROID STIMULATING HORMONE (SENSITIVE TSH)   Result Value Ref Range    TSH 5.573 (H) 0.350 - 5.000 uIU/mL   MANUAL DIFF AND MORPHOLOGY-SYSMEX   Result Value Ref Range    NEUTROPHIL % 58 %    LYMPHOCYTE %  37 %    MONOCYTE % 5 %    EOSINOPHIL % 0 %    BASOPHIL % 0 %    NEUTROPHIL # 9.11 (H) 1.50 - 7.70 x10^3/uL    LYMPHOCYTE # 5.81 (H) 1.00 - 4.80 x10^3/uL    MONOCYTE # 0.79 0.20 - 1.10 x10^3/uL    EOSINOPHIL # <0.10 <=0.50 x10^3/uL    BASOPHIL # <0.10 <=0.20 x10^3/uL    NRBC FROM MANUAL DIFF 0 per 100 WBC    RBC MORPHOLOGY Normal RBC and PLT Morphology    VENOUS BLOOD GAS/LACTATE   Result Value Ref Range    %  FIO2 (VENOUS) 100.0 %    PH (VENOUS) 7.27 (L) 7.31 - 7.41    PCO2 (VENOUS) 49.00 41.00 - 51.00 mm/Hg    PO2 (VENOUS) 24.0 (L) 35.0 - 50.0 mm/Hg    BASE DEFICIT 4.7 (H) -3.0 - 3.0 mmol/L    BICARBONATE (VENOUS) 19.5 (L) 22.0 - 26.0 mmol/L    LACTATE 2.9 (H) 0.0 - 1.3 mmol/L   TYPE AND SCREEN   Result Value Ref Range    UNITS ORDERED 4     ABO/RH(D) A NEGATIVE     ANTIBODY SCREEN NEGATIVE     SPECIMEN EXPIRATION DATE 10/08/2017    CROSSMATCH RED CELLS - UNITS   Result Value Ref Range    Coding System ISBT128     UNIT NUMBER [id number]     BLOOD COMPONENT TYPE LR RBC,  Adsol3, 04730     UNIT DIVISION 00     UNIT DISPENSE STATUS RELEASED FROM ALLOCATION     TRANSFUSION STATUS OK TO TRANSFUSE     IS CROSSMATCH COMPATIBLE     Product Code E0382V00     Coding System ISBT128     UNIT NUMBER [id number]     BLOOD COMPONENT TYPE LR RBC, Adsol3, 04730     UNIT DIVISION 00     UNIT DISPENSE STATUS RELEASED FROM ALLOCATION     TRANSFUSION STATUS OK TO TRANSFUSE     IS CROSSMATCH COMPATIBLE     Product Code E0382V00     Coding System ISBT128     UNIT NUMBER [id number]     BLOOD COMPONENT TYPE LR RBC, Adsol3, 04730     UNIT DIVISION 00     UNIT DISPENSE STATUS RELEASED FROM ALLOCATION     TRANSFUSION STATUS OK TO TRANSFUSE     IS CROSSMATCH COMPATIBLE     Product Code B2841L24     Coding System ISBT128     UNIT NUMBER [id number]     BLOOD COMPONENT TYPE LR RBC, Adsol3, 04730     UNIT DIVISION 00     UNIT DISPENSE STATUS RELEASED FROM ALLOCATION     TRANSFUSION STATUS OK TO TRANSFUSE     IS CROSSMATCH COMPATIBLE     Product Code I3474Q59    POCT ISTAT CG8 BLOOD GAS ELECTROLYTES HEMATOCRIT (RESULTS)   Result Value Ref Range    SODIUM, POC 142 136 - 145 mmol/L    POTASSIUM, POC 3.1 (L) 3.5 - 5.0 mmol/L    IONIZED CALCIUM, POC 1.08 (L) 1.30 - 1.46 mmol/L    GLUCOSE, POC 136 (H) 70 - 105 mg/dl    HEMATOCRIT, POC 56.3 (L) 33.5 - 45.2 %    HEMOGLOBIN, POC 9.9 (L) 11.2 - 15.2 g/dL    PH (PHP) 8.75 (L) 6.43 - 7.45    PCO2 (PCO2P) 41 35 - 45 mmHg    PO2 (PO2P) 401 (H) 72 - 100 mmHg    TCO2 (TOC2P) 21 (L) 22 - 32 mmol/L    HCO3 (HCO3P) 20 (L) 22 - 26 mmol/L    BASE EXCESS (BEP) -7.0 (L) -2.0 - 3.0 mEq/L    SO2 (SO2P) 100 >=80 %     Additional Labs Ordered: No      Radiology Tests: CXR:  Direct visualization of the image on 10/05/2017 on [place] PACS showed  IMPRESSION:  Cardiomediastinal silhouette is prominent. There is left base haziness  which may represent some atelectasis. There is likely underlying COPD. No  pulmonary edema or definite pneumonia seen.  Plan     ASSESSMENT & PLAN:    Patient  Active Problem List    Diagnosis Date Noted   . Aortic dissection (CMS HCC) 10/05/2017       Patient has decision making capacity:  no  Advance Directive:  No Advance Directive  DNR Status:  No CPR    DVT RISK FACTORS HAVE BEEN ASSESSED AND PROPHYLAXIS ORDERED (SEE RUBYONLINE - REFERENCE TOOLS - MD, DVT PROPHY OR POCKET CARD):  YES    Surgery aborted. Admitted to ICU on levo, vaso, and epi gtts. Family present at bedside. Goals of care discussion. Plan for now is for DNR, no escalation of care.         Kathrynn Humble, APRN  10/05/2017, 20:32    Pt seen.  Chart and TTE reviewed.  She has advanced cardiogenic shock from acute Type I dissection.  We offered emergent surgery to husband, but he declined.  He stated that patient would not want to possibly require prolonged intubation and/or dialysis.  He is aware that the prognosis is nil without emergent surgery.  Comfort measures are indicated.    HGR, MD            Copies sent to Care Team       Relationship Specialty Notifications Start End    Pcp, No PCP - General   10/05/17

## 2017-10-05 NOTE — Anesthesia OR-ICU Handoff (Signed)
Anesthesia ICU Transfer of Care  Laureen OchsDorothy Briggs is a 72 y.o. ,female, Weight: 68.4 kg (150 lb 12.7 oz)   had Procedure(s):  Case Cancelled In Or  performed  10/05/17   Primary Service: Vicente SereneHarold Roberts, MD    No past medical history on file.   Allergy History as of 10/05/17      No Allergies on File              I completed my ICU transfer of care/ Handoff to the ICU receiving personnel during which we discussed :  All key and critical aspects of case discussed and Gave opportunity for questions and acknowledgement of understanding                                                Additional Info:Case cancelled in OR after intubation. surgeon discussed high morbidity and mortality risks with family.                      Last OR Temp: Temperature: 37.2 C (99 F)  ABG:  PCO2 (PCO2P)   Date Value Ref Range Status   10/05/2017 41 35 - 45 mmHg Final     PCO2 (VENOUS)   Date Value Ref Range Status   10/05/2017 49.00 41.00 - 51.00 mm/Hg Final     PO2 (PO2P)   Date Value Ref Range Status   10/05/2017 401 (H) 72 - 100 mmHg Final     PO2 (VENOUS)   Date Value Ref Range Status   10/05/2017 24.0 (L) 35.0 - 50.0 mm/Hg Final     POTASSIUM   Date Value Ref Range Status   10/05/2017 3.6 3.5 - 5.1 mmol/L Final     POTASSIUM, POC   Date Value Ref Range Status   10/05/2017 3.1 (L) 3.5 - 5.0 mmol/L Final     CALCIUM   Date Value Ref Range Status   10/05/2017 9.0 8.5 - 10.2 mg/dL Final     IONIZED CALCIUM, POC   Date Value Ref Range Status   10/05/2017 1.08 (L) 1.30 - 1.46 mmol/L Final     LACTATE   Date Value Ref Range Status   10/05/2017 2.9 (H) 0.0 - 1.3 mmol/L Final     BASE EXCESS (BEP)   Date Value Ref Range Status   10/05/2017 -7.0 (L) -2.0 - 3.0 mEq/L Final     BASE DEFICIT   Date Value Ref Range Status   10/05/2017 4.7 (H) -3.0 - 3.0 mmol/L Final     HCO3 (HCO3P)   Date Value Ref Range Status   10/05/2017 20 (L) 22 - 26 mmol/L Final     BICARBONATE (VENOUS)   Date Value Ref Range Status   10/05/2017 19.5 (L) 22.0 - 26.0 mmol/L  Final     %FIO2 (VENOUS)   Date Value Ref Range Status   10/05/2017 100.0 % Final     Airway:  EndoTracheal Tube Oral;Uncuffed 7.0 20 cm Lip (Active)   Airway Secure Device 10/05/2017  5:00 PM

## 2017-10-06 ENCOUNTER — Non-Acute Institutional Stay (HOSPITAL_COMMUNITY): Payer: Self-pay | Admitting: Hospice and Palliative Medicine

## 2017-10-06 ENCOUNTER — Non-Acute Institutional Stay
Admission: RE | Admit: 2017-10-06 | Discharge: 2017-10-24 | Disposition: E | Payer: Self-pay | Source: Intra-hospital | Attending: Hospice and Palliative Medicine | Admitting: Hospice and Palliative Medicine

## 2017-10-06 DIAGNOSIS — Z66 Do not resuscitate: Secondary | ICD-10-CM | POA: Insufficient documentation

## 2017-10-06 DIAGNOSIS — Z515 Encounter for palliative care: Secondary | ICD-10-CM

## 2017-10-06 DIAGNOSIS — R570 Cardiogenic shock: Secondary | ICD-10-CM

## 2017-10-06 DIAGNOSIS — I7100 Dissection of unspecified site of aorta: Secondary | ICD-10-CM

## 2017-10-06 DIAGNOSIS — I7101 Dissection of thoracic aorta: Secondary | ICD-10-CM

## 2017-10-06 DIAGNOSIS — R451 Restlessness and agitation: Secondary | ICD-10-CM | POA: Insufficient documentation

## 2017-10-06 DIAGNOSIS — R627 Adult failure to thrive: Secondary | ICD-10-CM

## 2017-10-06 MED ORDER — LORAZEPAM 2 MG/ML INJECTION SOLUTION
1.0000 mg | INTRAMUSCULAR | Status: DC | PRN
Start: 2017-10-06 — End: 2017-10-07
  Administered 2017-10-06 – 2017-10-07 (×2): 1 mg via INTRAVENOUS
  Filled 2017-10-06 (×2): qty 1

## 2017-10-06 MED ORDER — LORAZEPAM 2 MG/ML INJECTION SOLUTION
1.0000 mg | INTRAMUSCULAR | Status: DC
Start: 2017-10-06 — End: 2017-10-07
  Administered 2017-10-06: 0 mg via INTRAVENOUS
  Administered 2017-10-06: 1 mg via INTRAVENOUS
  Administered 2017-10-07 (×3): 0 mg via INTRAVENOUS
  Filled 2017-10-06 (×2): qty 1

## 2017-10-06 MED ORDER — ONDANSETRON HCL (PF) 4 MG/2 ML INJECTION SOLUTION
4.00 mg | Freq: Three times a day (TID) | INTRAMUSCULAR | Status: AC | PRN
Start: 2017-10-06 — End: ?

## 2017-10-06 MED ORDER — MORPHINE 2 MG/ML INTRAVENOUS SYRINGE
1.00 mg | INJECTION | INTRAVENOUS | Status: AC | PRN
Start: 2017-10-06 — End: ?

## 2017-10-06 MED ORDER — SODIUM CHLORIDE 0.9 % (FLUSH) INJECTION SYRINGE
2.00 mL | INJECTION | Freq: Three times a day (TID) | INTRAMUSCULAR | Status: AC
Start: 2017-10-06 — End: ?

## 2017-10-06 MED ORDER — PCA - MORPHINE 1 MG/ML IN NS
INJECTION | INTRAVENOUS | Status: DC
Start: 2017-10-06 — End: 2017-10-07
  Filled 2017-10-06: qty 50

## 2017-10-06 MED ORDER — GLYCOPYRROLATE 0.2 MG/ML INJECTION SOLUTION
200.00 ug | INTRAMUSCULAR | Status: DC | PRN
Start: 2017-10-06 — End: 2017-10-07
  Administered 2017-10-07: 200 ug via INTRAVENOUS
  Filled 2017-10-06: qty 1

## 2017-10-06 MED ORDER — LORAZEPAM 2 MG/ML INJECTION SOLUTION
1.0000 mg | INTRAMUSCULAR | Status: DC
Start: 2017-10-06 — End: 2017-10-06

## 2017-10-06 MED ORDER — SODIUM CHLORIDE 0.9 % (FLUSH) INJECTION SYRINGE
2.00 mL | INJECTION | INTRAMUSCULAR | Status: AC | PRN
Start: 2017-10-06 — End: ?

## 2017-10-06 MED ORDER — LORAZEPAM 2 MG/ML INJECTION SOLUTION
1.00 mg | INTRAMUSCULAR | Status: DC | PRN
Start: 2017-10-06 — End: 2017-10-06
  Administered 2017-10-06 (×2): 1 mg via INTRAVENOUS
  Filled 2017-10-06: qty 1

## 2017-10-06 MED ORDER — LORAZEPAM 2 MG/ML INJECTION SOLUTION
1.00 mg | INTRAMUSCULAR | Status: AC | PRN
Start: 2017-10-06 — End: ?

## 2017-10-06 MED ORDER — LORAZEPAM 2 MG/ML INJECTION SOLUTION
0.5000 mg | INTRAMUSCULAR | Status: AC
Start: 2017-10-06 — End: 2017-10-06
  Administered 2017-10-06: 09:00:00 0.5 mg via INTRAVENOUS
  Filled 2017-10-06: qty 1

## 2017-10-06 MED ORDER — LORAZEPAM 2 MG/ML INJECTION SOLUTION
0.5000 mg | INTRAMUSCULAR | Status: AC
Start: 2017-10-06 — End: 2017-10-06
  Administered 2017-10-06 (×2): 0.5 mg via INTRAVENOUS
  Filled 2017-10-06: qty 1

## 2017-10-06 MED ORDER — MORPHINE 2 MG/ML INTRAVENOUS SYRINGE
1.0000 mg | INJECTION | INTRAVENOUS | Status: DC | PRN
Start: 2017-10-06 — End: 2017-10-07
  Administered 2017-10-07: 1 mg via INTRAVENOUS
  Filled 2017-10-06: qty 1

## 2017-10-06 MED ORDER — MORPHINE 2 MG/ML INTRAVENOUS SYRINGE
1.00 mg | INJECTION | INTRAVENOUS | Status: DC | PRN
Start: 2017-10-06 — End: 2017-10-06
  Administered 2017-10-06 (×2): 1 mg via INTRAVENOUS
  Filled 2017-10-06 (×2): qty 1

## 2017-10-06 MED ORDER — LORAZEPAM 2 MG/ML INJECTION SOLUTION
1.00 mg | INTRAMUSCULAR | Status: DC | PRN
Start: 2017-10-06 — End: 2017-10-06
  Administered 2017-10-06 (×2): 1 mg via INTRAVENOUS
  Filled 2017-10-06: qty 1

## 2017-10-06 MED ORDER — ACETAMINOPHEN 650 MG RECTAL SUPPOSITORY
650.0000 mg | RECTAL | Status: DC | PRN
Start: 2017-10-06 — End: 2017-10-07

## 2017-10-06 MED ORDER — MORPHINE 2 MG/ML INTRAVENOUS SYRINGE
INJECTION | INTRAVENOUS | Status: AC
Start: 2017-10-06 — End: 2017-10-06
  Administered 2017-10-06: 1 mg via INTRAVENOUS
  Filled 2017-10-06: qty 1

## 2017-10-06 MED ORDER — MORPHINE 2 MG/ML INTRAVENOUS SYRINGE
1.00 mg | INJECTION | INTRAVENOUS | Status: DC | PRN
Start: 2017-10-06 — End: 2017-10-06
  Administered 2017-10-06 (×6): 1 mg via INTRAVENOUS
  Filled 2017-10-06 (×5): qty 1

## 2017-10-06 MED ADMIN — ferrous sulfate 325 mg (65 mg iron) tablet: INTRAVENOUS | @ 05:00:00

## 2017-10-06 NOTE — Progress Notes (Signed)
[facility]  HVI CRITICAL CARE CROSS COVERAGE NOTE    Laureen Ochsorothy Scinto  Date of Service: 03/20/18  Date of Birth: [birth year]  Hospital Day: 1      Called to bedside by nurse for family concerns of change in neurological status and emesis. Patient assisted in upright position and vomiting upon entry. Patient repositioned comfortably back in bed. Upon assessment, patient is obtunded and does not follow commands.     Discussion held with family regarding wishes of further care. Sequelae of untreated dissection discussed including potential conditions that may arise secondary to further dissection, not limited to stroke, heart attack, and probable patient expiration. Family states that the patient would not want "life support" measures. Discussed maintaining intravenous vasopressor support with family without escalation of care. The patient's daughter and son-in-law both felt that the patient would not want to be kept alive with the assistance of life saving medications.     The decision was made by the family to transition the patient to full comfort care at that time. PRN pain medications were ordered and orders placed for vasopressors to be discontinued, per the family wishes.     Chaplain/Pastoral services were offered to the family at that time, for which they declined.          Stevie KernMegan Tristy Udovich, APRN, ACCNS-AG  03/20/18, 00:22

## 2017-10-06 NOTE — Consults (Signed)
[facility]                                                    SUPPORTIVE/PALLIATIVE CARE MEDICINE CONSULT    Date of Service:  10/09/2017  Requesting MD: Dr. 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  Patient lying in bed, unresponsive, no palpable pulses in any extremities, fingertips cool to the touch, feet are cool up to the mid calf. Patient has had 10 doses of PRN Morphine to help with comfort. Patient appears to get agitated when Ativan wears off and would benefit from scheduled dose. Spoke to family about inpatient hospice and they are agreeable at this time. Primary team updated on plan of care.    Recommend:   -NO CPR/DNI  -Comfort Measures Only  -Admit to inpatient hospice  -Ativan 1mg  IV every 4 hours scheduled  -Ativan 1mg  Iv every 1 hour PRN for agitation  -Morphine 1mg  IV every 15 minutes PRN for air hunger,dyspnea, RR >24  -Admit to inpatient hospice    Decision Making Capacity:  No    Advance Directives prior to consultation:  MPOA- No;  Living Will- No;Healthcare Surrogate:  Yes, Name: Engineer, drillingTammy Marks     Patient/Family Meeting held, present were: Rodney LangtonJ. Zimmer, MSW, Mercy RidingJ. Vanin, FNP and Tammy [facility]), Peyton NajjarLarry (husband) and steppdaughter    Discussion with patient and family included: diagnoses, current problems, treatment limitations, treatment options, hospice, pt/family goals for care, pain history, prior treatment and other symptom management    Location of pt/family meeting:  bedside  Outcome of pt/family meeting: No CPR, Do Not Intubate, No antibiotics, No bipap, No dialyisis, No hyperalimentation, No IV fluid, No lab studies, No routine diagnositc tests, No supplemental oxygen, No transfer to ICU/Stepdown, No tube feedings, No vasopressors, No x-rays and Comfort Meausres Only    Patient/Family primary goal(s)  include: Inpatient hospice    Information Obtained from: daughter and history reviewed via medical record     HPI/Discussion:  "72 y.o., White female who presents s/p chest pain @ [employer]. Altered mental status. Epinephrine for hypotension. [facility]. On arrival, dopplarable femoral pulses. ED MD used ultrasound to find type A aortic dissection. SCT consulted emergently to ED. Dr. Anette RiedelWei and Dr. Su Hiltoberts spoke directly with family, decision was made to abort surgery and pursue comfort measures due to risks of surgery, including mortality."  Over a 23 hour hospital stay the patients family had chosen to not pursue treatment and proceed with comfort measures only. Supportive care consulted for inpatient hospice evaluation.     Her daughter states that she becomes agitated every several hours and seems to be in pain, with furrowed brow.      PMH, SH, FH, ROS -- unable to be obtained due to altered mental status secondary to aortic dissection.      Symptom Assessment:    Pain: PainAD  2  Other symptoms present: Agitation    Pt/Family unit dynamics: Patient up visiting from [place] to see family, lived at home prior to admission  Spiritual Assessment:  Not assessed    Medical orders prior to consultation: Post - POST form not discussed DNR Card - No  Code Status:  No CPR  Treatment Limitations prior to consultation:  No antibiotics, No bipap, No dialyisis, No hyperalimentation, No IV fluid, No lab studies, No routine diagnositc tests, No supplemental oxygen, No transfer to ICU/Stepdown, No tube feedings, No vasopressors, No x-rays and Do not intubate    Exam:  Temperature: 36.5 C (97.7 F)  Heart Rate: 99  BP (Non-Invasive): (!) 75/56  Respiratory Rate: 20  SpO2: (!) 84 %  Pain Score (Numeric, Faces): Other    Constitutional:  appears chronically ill, mild distress and vital signs reviewed  Eyes:  Conjunctiva clear., Sclera non-icteric.   ENT:  Nose without erythema. , Mouth mucous membranes dry.      Neck:  no thyromegaly or lymphadenopathy and supple, symmetrical, trachea midline  Respiratory:  decreased breath sounds throughout all lung fields  Cardiovascular:  regular rate and rhythm  Gastrointestinal:  Soft, non-tender, Bowel sounds normal, non-distended, No hepatosplenomegaly  Musculoskeletal:  Head atraumatic and normocephalic and PROM  Integumentary:  Skin warm and dry and Patients hands and feet are cold, no palpable pulse in extremities  Neurologic:  Unresponsive    Ventilator Settings: N/A  HFNC Settings:N/A  BIPAP Settings:N/A  Automatic Implantable Cardiac Defibrillator  No    Palliative Performance Scale-  10    Labs:    Lab Results Today:    Results for orders placed or performed during the hospital encounter of 10/05/17 (from the past 24 hour(s))   BASIC METABOLIC PANEL, NON-FASTING   Result Value Ref Range    SODIUM 139 136 - 145 mmol/L    POTASSIUM 3.6 3.5 - 5.1 mmol/L    CHLORIDE 105 96 - 111 mmol/L    CO2 TOTAL 22 22 - 32 mmol/L    ANION GAP 12 4 - 13 mmol/L    CALCIUM 9.0 8.5 - 10.2 mg/dL    GLUCOSE 161 (H) 65 - 139 mg/dL    BUN 26 (H) 8 - 25 mg/dL    CREATININE 0.96 (H) 0.49 - 1.10 mg/dL    BUN/CREA RATIO 19 6 - 22    ESTIMATED GFR 39 (L) >59 mL/min/1.86m^2   PT/INR   Result Value Ref Range    PROTHROMBIN TIME 11.4 9.5 - 14.1 seconds    INR 0.97 0.80 - 1.20   PTT (PARTIAL THROMBOPLASTIN TIME)   Result Value Ref Range    APTT 34.0 24.1 - 38.5 seconds   TROPONIN-I (FOR ED ONLY)   Result Value Ref Range    TROPONIN I 43 (HH) 0 - 30 ng/L   CBC WITH DIFF   Result Value Ref Range    WBC 15.7 (H) 3.7 - 11.0 x10^3/uL    RBC 4.09 3.85 - 5.22 x10^6/uL    HGB 12.5 11.5 - 16.0 g/dL    HCT 04.5 40.9 - 81.1 %    MCV 96.3 78.0 - 100.0 fL    MCH 30.6 26.0 - 32.0 pg    MCHC 31.7 31.0 - 35.5 g/dL    RDW-CV 91.4 78.2 - 95.6 %    PLATELETS 263 150 - 400 x10^3/uL    MPV 9.5 8.7 - 12.5 fL   MAGNESIUM   Result Value Ref Range    MAGNESIUM 2.1 1.6 - 2.6 mg/dL   PHOSPHORUS   Result Value Ref Range    PHOSPHORUS  4.8 (H) 2.3 - 4.0 mg/dL   ALT (SGPT)   Result Value Ref Range    ALT (SGPT) 30 <55 U/L   AST (SGOT)   Result  Value Ref Range    AST (SGOT) 39 8 - 41 U/L   HGA1C (HEMOGLOBIN A1C WITH EST AVG GLUCOSE)   Result Value Ref Range    HEMOGLOBIN A1C 5.9 4.0 - 6.0 %    ESTIMATED AVERAGE GLUCOSE 123 mg/dL   LIPID PANEL   Result Value Ref Range    CHOLESTEROL 166 <200 mg/dL    HDL CHOL 48 (L) >=09 mg/dL    TRIGLYCERIDES 811 <=914 mg/dL    LDL CALC 89 <=782 mg/dL    VLDL CALC 29 <95 mg/dL    NON-HDL 621 <=308 mg/dL    CHOL/HDL RATIO 3.5    THYROID STIMULATING HORMONE (SENSITIVE TSH)   Result Value Ref Range    TSH 5.573 (H) 0.350 - 5.000 uIU/mL   MANUAL DIFF AND MORPHOLOGY-SYSMEX   Result Value Ref Range    NEUTROPHIL % 58 %    LYMPHOCYTE %  37 %    MONOCYTE % 5 %    EOSINOPHIL % 0 %    BASOPHIL % 0 %    NEUTROPHIL # 9.11 (H) 1.50 - 7.70 x10^3/uL    LYMPHOCYTE # 5.81 (H) 1.00 - 4.80 x10^3/uL    MONOCYTE # 0.79 0.20 - 1.10 x10^3/uL    EOSINOPHIL # <0.10 <=0.50 x10^3/uL    BASOPHIL # <0.10 <=0.20 x10^3/uL    NRBC FROM MANUAL DIFF 0 per 100 WBC    RBC MORPHOLOGY Normal RBC and PLT Morphology    VENOUS BLOOD GAS/LACTATE   Result Value Ref Range    %FIO2 (VENOUS) 100.0 %    PH (VENOUS) 7.27 (L) 7.31 - 7.41    PCO2 (VENOUS) 49.00 41.00 - 51.00 mm/Hg    PO2 (VENOUS) 24.0 (L) 35.0 - 50.0 mm/Hg    BASE DEFICIT 4.7 (H) -3.0 - 3.0 mmol/L    BICARBONATE (VENOUS) 19.5 (L) 22.0 - 26.0 mmol/L    LACTATE 2.9 (H) 0.0 - 1.3 mmol/L   TYPE AND SCREEN   Result Value Ref Range    UNITS ORDERED 4     ABO/RH(D) A NEGATIVE     ANTIBODY SCREEN NEGATIVE     SPECIMEN EXPIRATION DATE 10/08/2017    CROSSMATCH RED CELLS - UNITS   Result Value Ref Range    Coding System ISBT128     UNIT NUMBER [id number]     BLOOD COMPONENT TYPE LR RBC, Adsol3, 04730     UNIT DIVISION 00     UNIT DISPENSE STATUS RELEASED FROM ALLOCATION     TRANSFUSION STATUS OK TO TRANSFUSE     IS CROSSMATCH COMPATIBLE     Product Code E0382V00     Coding System ISBT128     UNIT NUMBER  [id number]     BLOOD COMPONENT TYPE LR RBC, Adsol3, 04730     UNIT DIVISION 00     UNIT DISPENSE STATUS RELEASED FROM ALLOCATION     TRANSFUSION STATUS OK TO TRANSFUSE     IS CROSSMATCH COMPATIBLE     Product Code E0382V00     Coding System ISBT128     UNIT NUMBER [id number]     BLOOD COMPONENT TYPE LR RBC, Adsol3, 04730     UNIT DIVISION 00     UNIT DISPENSE STATUS RELEASED FROM ALLOCATION     TRANSFUSION STATUS OK TO TRANSFUSE     IS CROSSMATCH COMPATIBLE     Product Code E0382V00     Coding System ISBT128     UNIT NUMBER [id number]     BLOOD COMPONENT TYPE  LR RBC, Adsol3, 04730     UNIT DIVISION 00     UNIT DISPENSE STATUS RELEASED FROM ALLOCATION     TRANSFUSION STATUS OK TO TRANSFUSE     IS CROSSMATCH COMPATIBLE     Product Code Z6109U04    POCT ISTAT CG8 BLOOD GAS ELECTROLYTES HEMATOCRIT (RESULTS)   Result Value Ref Range    SODIUM, POC 142 136 - 145 mmol/L    POTASSIUM, POC 3.1 (L) 3.5 - 5.0 mmol/L    IONIZED CALCIUM, POC 1.08 (L) 1.30 - 1.46 mmol/L    GLUCOSE, POC 136 (H) 70 - 105 mg/dl    HEMATOCRIT, POC 54.0 (L) 33.5 - 45.2 %    HEMOGLOBIN, POC 9.9 (L) 11.2 - 15.2 g/dL    PH (PHP) 9.81 (L) 1.91 - 7.45    PCO2 (PCO2P) 41 35 - 45 mmHg    PO2 (PO2P) 401 (H) 72 - 100 mmHg    TCO2 (TOC2P) 21 (L) 22 - 32 mmol/L    HCO3 (HCO3P) 20 (L) 22 - 26 mmol/L    BASE EXCESS (BEP) -7.0 (L) -2.0 - 3.0 mEq/L    SO2 (SO2P) 100 >=80 %       Imaging Studies:  Images and Reports reviewed to current date.    Consultant:  Domingo Sep, APRN,FNP-BC    Total Time of Encounter: 45  minutes  Time in Counseling as above: 25 minutes    Domingo Sep, APRN,FNP-BC

## 2017-10-06 NOTE — Nurses Notes (Signed)
Patient on comfort measures. Patient is resting with eyes closed, occasionally jerking arms up. Multiple family members at bedside, this RN and family feels patient appears comfortable at this time. PRN Morphine ordered and will be given Q7015min,as needed, last given at 0658. Will continue to monitor and assess.

## 2017-10-06 NOTE — Discharge Summary (Signed)
[facility]   DISCHARGE SUMMARY    PATIENT NAME:  Gloria Briggs, Gloria Briggs  MRN:  [id number]  DOB:  27-Jun-1945    ENCOUNTER DATE:  10/05/2017  INPATIENT ADMISSION DATE: 10/05/2017  DISCHARGE DATE:  09/25/2017    ATTENDING PHYSICIAN: Jaynee Eagles, MD  SERVICE: HOSPITALIST 6  PRIMARY CARE PHYSICIAN: No Pcp     Has PCP been verified with patient and updated? Yes    LAY CAREGIVER:  ,  ,      PRIMARY DISCHARGE DIAGNOSIS:   Active Hospital Problems    Diagnosis Date Noted   . Aortic dissection (CMS [employer]) 10/05/2017      Resolved Hospital Problems   No resolved problems to display.     There are no active non-hospital problems to display for this patient.       DISCHARGE MEDICATIONS:     Current Discharge Medication List      START taking these medications.      Details   LORazepam 2 mg/mL Solution  Commonly known as:  ATIVAN   1 mg, Intravenous, EVERY 1 HOUR PRN  Refills:  0     morphine 2 mg/mL Syringe   1 mg, Intravenous, EVERY 15 MIN PRN  Refills:  0     * NS flush Syringe   2 mL, Intracatheter, EVERY 8 HOURS (SCHEDULED)  Refills:  0     * NS flush Syringe   2-6 mL, Intracatheter, EVERY 1 MIN PRN  Refills:  0     * NS flush Syringe   2 mL, Intracatheter, EVERY 8 HOURS (SCHEDULED)  Refills:  0     * NS flush Syringe   2-6 mL, Intracatheter, EVERY 1 MIN PRN  Refills:  0     ondansetron 2 mg/mL Solution  Commonly known as:  ZOFRAN   4 mg, Intravenous, EVERY 8 HOURS PRN  Refills:  0         * This list has 4 medication(s) that are the same as other medications prescribed for you. Read the directions carefully, and ask your doctor or other care provider to review them with you.              Discharge med list refreshed?  YES    During this hospitalization did the patient have an AMI, PCI/PCTA, STENT or Isolated CABG?  No              ALLERGIES:  No Known Allergies    HOSPITAL PROCEDURE(S):   Bedside Procedures:  No orders of the defined types were placed in this encounter.    Surgical Procedure(s):  Hvi Procedure Case  Cancel    REASON FOR HOSPITALIZATION AND HOSPITAL COURSE     BRIEF HPI:  This is a 72 y.o., female admitted for chest pain and altered mental status.     BRIEF HOSPITAL NARRATIVE:   Ellyssa Zagal is a 72yo female who presented with chest pain and altered mental status, and was found to have a type A aortic dissection. CT Surgery was consulted, and after a goals of care discussion with family, husband decided to make the patient comfort measures only.     Cardiogenic shock secondary to type A aortic dissection  Comfort measures only  - CT Surgery consulted in ED. Discussed goals of care with patient's family. Husband decided against surgery and to make patient comfort measures only.  - Supportive Care consulted.  - CMO.  - Continue morphine 1mg  q57min prn, Ativan 1mg  q1h prn.  TRANSITION/POST DISCHARGE CARE/PENDING TESTS/REFERRALS: Transfer to inpatient hospice.     CONDITION ON DISCHARGE:  A. Ambulation: Bed rest  B. Self-care Ability: Incapable  C. Cognitive Status Comatose  D. Code status at discharge:   Code Status Information     Code Status    No CPR        LINES/DRAINS/WOUNDS AT DISCHARGE:   Patient Lines/Drains/Airways Status    Active Line / Dialysis Catheter / Dialysis Graft / Drain / Airway / Wound     Name: Placement date: Placement time: Site: Days:    Peripheral IV Left Median Cubital  (antecubital fossa)  10/05/17   1530   less than 1    Peripheral IV Right Median Cubital  (antecubital fossa)  10/05/17   1531   less than 1              DISCHARGE DISPOSITION:  Inpatient Hospice    DISCHARGE INSTRUCTIONS:    No discharge procedures on file.     France RavensKylene Lovejoy, APRN,FNP-BC  10/03/2017, 14:09    Copies sent to Care Team       Relationship Specialty Notifications Start End    Pcp, No PCP - General   10/05/17           Referring providers can utilize [email or web address] to access their referred [facility] patient's information.       I personally saw and examined the patient. See Nurse Practitioner's  note for additional details. My findings are mentioned above.     Jaynee EaglesAbdul Lucille Witts, MD

## 2017-10-06 NOTE — Care Plan (Signed)
[facility]  Spiritual Care Note    Patient Name:  Laureen OchsDorothy Kiang  Date of Encounter:   09/26/2017       10/23/2017 1425   Clinical Encounter Type   Reason for Visit Death Anticipated   Referral From Physician   Declined Spiritual Care Visit At This Time No   Visited With Spouse;Daughter;Other (Comment)   (step-son, step-daughter, son-in-law)   Family Spiritual Encounters   Spiritual Assessment Changes related to patient's diagnosis;Grief;Image of God/Sacred;Uncertainty   Spiritual/Coping Resources Beliefs helpful in coping;Beliefs in God/Sacred/Higher Purpose;Loved by God/Sacred;Image of God/Sacred;Prayer/devotional life;Supportive family system   Interpersonal/Family Stressors Distance from home (list distance)  (8+ hours)   End-of-Life Issues Imminent Death   Coping  Relationship building;Provided supportive presence;Offered empathy;Facilitated grief;Facilitated story telling;Explored emotions;Explored/supported faith and Retail buyerbeliefs   Support Services Provided Non-anxious presence;Hospitality   Information Provided Spiritual Care scope of service   Spiritual Care Outcomes with Family   Spiritual/Emotional Processing Spiritual Care relationship established;Family shared/processed patient's story;Family processed emotions   Family Coping No change  (Family continues to grieve)   Time of Encounters   Start Time 1345   Stop Time 1425   Duration (minutes) 40 Minutes       Other Pertinent Information:  I was visited with the family of Ms. Chancy HurterJester gathered at bedside as she was evaluated for inpatient hospice. Her family shared about the events which led to Ms. Buzzy HanJester's hospitalization, stating that "everything happened so fast."  They shared that they are still trying to "make sense" of how quickly the Patient's condition has changed.  I offered support as they shared stories about their lives and the Patient's life. Before excusing myself, the Patient's daughter Babette Relic(Tammy) was very tearful as she shared that "today  is the day that her mother gave birth to her."  She shared that her parents always visited for her birthday, and knowing that her mother's imminent death at the same time as Tammy's birthday will be a reminder. I offered support to her as she shared this.  I did inform them of the 24/7 availability of chaplains for continued emotional and/or spiritual support as needed.     Barb MerinoKimberly J Joeziah Voit, [facility]  Pager: [phone]  Total Time of Encounter: 45 min.

## 2017-10-06 NOTE — Nurses Notes (Signed)
Admitted to hospice for dissected aorta. Unresponsive, mottling, cold to touch, no radial or pedal pulses. Lungs diminished. Family at bedside coping well.

## 2017-10-06 NOTE — Consults (Signed)
[facility]  MEDICINE CONSULT    Naveah, Brave, 72 y.o. female  Date of Birth:  [birth year]  Date of Admission:  10/05/2017  Date of service: 09/26/2017    Service: Cardiac Surgery  Requesting MD: Vicente Serene    Reason for consultation: CMO, transfer to medicine    Assessment/Plan:  Calie Buttrey is a 72yo female who presented with chest pain and altered mental status, and was found to have a type A aortic dissection. CT Surgery was consulted, and after a goals of care discussion with family, husband decided to make the patient comfort measures only. She was transferred to Med Hospitalist 6 service for further care.    Cardiogenic shock secondary to type A aortic dissection  Comfort measures only  - CT Surgery consulted in ED. Discussed goals of care with patient's family. Husband decided against surgery and to make patient comfort measures only.  - Supportive Care consulted  - CMO orders placed  - Continue morphine 1mg  q51min prn, Ativan 1mg  q1h prn    HPI: Tyeasha Ebbs is a 72yo female who presented with chest pain and altered mental status. A bedside ultrasound was done in the ED that showed a type A aortic dissection. She was intubated and started on pressors. CT Surgery was consulted emergently and offered surgery, however after speaking with patient's family, husband declined surgery and decided to make the patient comfort measures only. She was extubated and vasopressors were discontinued. Patient appears to be sleeping in bed comfortably. No acute distress and breathing is non-labored. Husband and several other family members are at bedside. Family states that the Ativan seems to be helping her stay comfortable the most.     Past Medical History  Unable to obtain due to altered mental status.    Past Surgical History  Unable to obtain due to altered mental status.    No Known Allergies  Family History  Family Medical History:     None        Social History  Social History     Tobacco Use   . Smoking status:  Not on file   Substance Use Topics   . Alcohol use: Not on file        Medications Prior to Admission     None          Current Facility-Administered Medications:  esmolol (BREVIBLOC) 100 mg/10 mL (10 mg/mL) injection ---Cabinet Override      esmolol in NS (BREVIBLOC) 2,000 mg/100 mL premix infusion ---Cabinet Override      LORazepam (ATIVAN) 2 mg/mL injection 1 mg Intravenous Q2H PRN   morphine 2 mg/mL injection 1 mg Intravenous Q15 Min PRN   NS flush syringe 2 mL Intracatheter Q8HRS   And      NS flush syringe 2-6 mL Intracatheter Q1 MIN PRN   NS flush syringe 2 mL Intracatheter Q8HRS   And      NS flush syringe 2-6 mL Intracatheter Q1 MIN PRN   ondansetron (ZOFRAN) 2 mg/mL injection 4 mg Intravenous Q8H PRN   Phenylephrine HCl 1 mg/10 mL (100 mcg/mL) IV dilution ---Cabinet Override        Review of Systems:  Unable to obtain due to altered mental status.    EXAM:  Temperature: 37.2 C (99 F)  Heart Rate: 85  BP (Non-Invasive): 116/74  Respiratory Rate: (!) 21  SpO2: (!) 78 %  General: Chronically ill-appearing, appears comfortable, no acute distress  HEENT: Conjunctiva clear, mucous membranes moist  Neck: supple,  symmetrical, trachea midline  Respiratory: Clear to auscultation bilaterally, breathing non-labored  Cardiovascular: regular rate and rhythm, distal pulses weak  Gastrointestinal: Soft, non-tender, non-distended, bowel sounds normal  Musculoskeletal:  NC/AT, no deformities noted  Integumentary: Skin cool, no rashes noted  Neurologic: sedated, withdraws from pain    Studies:  I have reviewed all available studies within the electronic medical record.    Crista CurbSarah Lobert, PA-C    I personally saw and evaluated the patient. See mid-level's note for additional details. My findings/participation are:  S:Patient has type a aortic aneurysm dissection.  Family decided not to pursue surgical intervention.  She is comfort measures only.  She is unable to give any history at this time.  O:   Appears in no acute  distress.  Pale appearing.  A/P:   Comfort measures only.  Ativan and morphine.  Supportive care to consult.    Danae ChenLindsay Lasundra Hascall, MD 10/15/2017, 12:44,  Hospitalist Medicine  Pager [phone]

## 2017-10-06 NOTE — Care Plan (Signed)
Problem: Adult Inpatient Plan of Care  Goal: Plan of Care Review  Outcome: Ongoing (see interventions/notes)  Goal: Patient-Specific Goal (Individualization)  Outcome: Ongoing (see interventions/notes)  Flowsheets (Taken 10/18/2017 1829)  Individualized Care Needs: keep Pt comfortable- Morphine PCA at '1mg'$ /hr.  Goal: Absence of Hospital-Acquired Illness or Injury  Outcome: Ongoing (see interventions/notes)  Goal: Optimal Comfort and Wellbeing  Outcome: Ongoing (see interventions/notes)  Goal: Rounds/Family Conference  Outcome: Ongoing (see interventions/notes)     Problem: End-of-Life Care  Goal: Comfort, Peace and Preserved Dignity  Outcome: Ongoing (see interventions/notes)       [facility] Highest Level of Mobility Goal      Date: 09/30/2017     JH-HLM Goal: 1    Exercise Level: 1- Lying in bed    Goal Outcome: Goal Achieved    Plan of care reviewed. Pt lethargic but cooperative with care this shift. Morphine PCA at '1mg'$ /hr. Assessment per doc flow. Pt free from falls this shift- family at bedside. Needs met with hourly rounding. Pt appears to be comfortable. Scheduled Ativan orders obtained with PRN ativan if needed. Pt resting at this time with call bell in reach. Wheels locked and bed in lowest position. Will continue to monitor.

## 2017-10-06 NOTE — Progress Notes (Signed)
[facility] Please call [facility] for questions.

## 2017-10-06 NOTE — Progress Notes (Signed)
[facility] CRITICAL CARE ATTENDING PROGRESS NOTE    [age] yr female presented to ED 10/05/17 after an episode of chest pain and altered mental status at [employer]. Pt found to have Type A aortic dissection. Pt ws taken emergently to the OR, however after the cardiac surgery team ( Dr. Anette RiedelWei and Dr. Su Hiltoberts) discussed the patient's condition and risks and benefits of surgery with the patient's husband and daughter, the pt's husband stated that she would not want to proceed with surgical intervention given the high risk of needing long term ventilator support, tracheostomy, renal replacement therapy.   The patient was then admitted to CVICU intubated, on vasopressors and inotropes and sedated from anesthesia. After further discussions with the family, they decided to allow patient to awaken from anesthesia, extubate her and initiate comfort measures.    Today the patient is lethargic, not following commands, intermittently opening her eyes and moving her arms. She appears comfortable with prn morphine and prn ativan ivp.    The CVICU team ( including myself) spoke with the patient's family and they would like to continue to keep the patient comfortable and allow natural death.   They were offered pastoral care services- they stated they have a family member coming in who is a pastor is coming in to the hospital.     CMO orders placed in the chart.    The patient will be transferred to the hospitalist/medicine service with hospice consult.    Seward CarolVeena Haevyn Ury, MD

## 2017-10-06 NOTE — Consults (Signed)
[facility] Medicine Consult  Follow Up Note    Liboria, Putnam, 72 y.o. female  Date of Service: 10/05/2017  Date of Birth:  [birth year]    Hospital Day:  LOS: 1 day     Assessment/Recommendations:  Met with patient's husband and dgt/HCS-Tammy along with J.Vanin, FNP to discuss inpatient hospice at [facility] through [facility]. Family in agreement with plan and "Freedom of Choice" completed and placed on chart.  Referral completed to [facility].    Perlie Gold, MSW

## 2017-10-06 NOTE — H&P (Signed)
[facility]                                                    General Inpatient Hospice H&P    Date of Service:  Oct 10, 2017     Assessment & Recommendations:      72yo woman with aortic dissection.  Goals of care CMO.  Enrolled in GIP for pain, agitation.    1.  Pain:  morphine 1mg /hr with 1mg  IV q 15 min prn  2.  Agitation:  lorazepam 1mg  IV q 4hr with 1mg  IV q 4 hr prn  3.  Excessive secretions:  glycopyrrolate 0.2mg  IV q 4 hr prn      Information Obtained from: relative, health care provider and history reviewed via medical record     HPI/Discussion:       "72 y.o.,[place]fem[place]ho presents s/p chest pain @ [employer]. Altered mental status. Epinephrine for hypotension. Scene run to [employer] via air medical. On arrival, dopplarable femoral pulses. ED MD used ultrasound to find type A aortic dissection. SCT consulted emergently to ED. Dr. Anette Riedel and Dr. Su Hilt spoke directly with family, decision was made to abort surgery and pursue comfort measures due to risks of surgery, including mortality."  Over a 23 hour hospital stay the patients family had chosen to not pursue treatment and proceed with comfort measures only. Supportive care consulted for inpatient hospice evaluation.     The above is taken from the Supportive Care initial consult note of October 10, 2017.    She is enrolled in hospice and admitted to [facility] for pain and agitation.      Encounter Start Date: 10-Oct-2017  Inpatient Admission DATE:      Active Hospital Problems   (*Primary Problem)    Diagnosis   . Aortic dissection (CMS HCC)     No past medical history on file.      No past surgical history pertinent negatives on file.      Past Family History:   Family Medical History:     None          ROS: Review of systems was not obtained due to altered mental status .    Symptom Assessment:  Rating of Symptom Intensity (0=NONE, 10=Severe)  Pain:  3  Depression:  unable to rate  Dyspnea:  0        The following symptoms are present: pain,  agitation    Pt/Family unit dynamics: family at bedside, expressing care and concern  Spiritual Assessment:  chaplaincy services consulted      Advance Directives:  MPOA- No;  Living Will- No;Healthcare Surrogate:  Yes, Name: Si Raider         Code Status:  No CPR  Treatment Limitations:  No antibiotics, No bipap, No dialyisis, No hyperalimentation, No lab studies, No routine diagnositc tests, No transfer to ICU/Stepdown, No tube feedings, No vasopressors, No x-rays and Do not intubate    Exam:     Constitutional:  appears chronically ill and vital signs reviewed  Eyes:  Conjunctiva clear., Sclera non-icteric.   ENT:  Nose without erythema. , Mouth mucous membranes dry.   Neck:  no thyromegaly or lymphadenopathy and supple, symmetrical, trachea midline  Respiratory:  decreased breath sounds throughout all lung fields.  Unlabored  Cardiovascular:  regular rate and rhythm  Gastrointestinal:  Soft, non-tender,  Bowel sounds normal, non-distended, No hepatosplenomegaly  Musculoskeletal:  Head atraumatic and normocephalic and PROM  Integumentary:  Skin warm and dry and Patients hands and feet are cold, no palpable pulse in extremities  Neurologic:  Unresponsive.  Small movement of RLE with stimulation    Palliative Performance Scale-  10    Decision Making Capacity-  No    Labs:  I have reviewed all lab results from the hospitalization immediately preceding admission        Glorious PeachJanna Baker Rogers, MD

## 2017-10-06 NOTE — Nurses Notes (Signed)
[facility] Pt appears comfortable with small episodes of arm movement and eyes opening up. PRN Ativan and Morphine ordered. Family at bedside. Plans to start Morphine 1mg  PCA with scheduling Ativan Q4H. Will continue to monitor.

## 2017-10-06 NOTE — Care Management Notes (Addendum)
Pt DNR/DNI with orders for CMO, and expected to expire this admission. Initial assessment deferred at this time. MSW SobieskiPrescott available, and will follow if needs should change.  Mikki HarborKatherine Deandre Stansel, MSW  [phone]    AddendumJolyn Lent: In-basket consult to Care Management for plan of care. Pt transferred to 7W. Supportive Care consulted for discussion of hospice, communication with patient/family, and symptom management. Plan to be determined following Supportive Care consult. Consult closed. VM message with hand-off left for [facility], who will continue to follow.

## 2017-10-07 MED ORDER — ATROPINE 1 % EYE DROPS
2.00 [drp] | OPHTHALMIC | Status: DC | PRN
Start: 2017-10-07 — End: 2017-10-07
  Filled 2017-10-07: qty 2

## 2017-10-07 MED ORDER — GLYCOPYRROLATE 0.2 MG/ML INJECTION SOLUTION
200.00 ug | INTRAMUSCULAR | Status: DC
Start: 2017-10-07 — End: 2017-10-07

## 2017-10-07 MED ORDER — MORPHINE 4 MG/ML INTRAVENOUS SOLUTION
3.00 mg | INTRAVENOUS | Status: DC | PRN
Start: 2017-10-07 — End: 2017-10-07

## 2017-10-24 NOTE — Progress Notes (Signed)
[facility]  General Inpatient Hospice Progress Note      Gloria Briggs, Gloria Briggs, 72 y.o. female  Date of Service: 05/07/17  Date of Birth:  24-Jan-1946    Hospital Day:  LOS: 0 days     Indication for GIP Level of Care:   Pain and agitation      Assessment/Recommendations:     71yo woman with aortic dissection.  Goals of care CMO.  Enrolled in GIP for pain, agitation.    1.  Pain:  continue morphine 1mg /hr with 1mg  IV q 15 min prn  2.  Agitation:   Discontinue scheduled lorazepam and continue with 1mg  IV q 4 hr prn   3.  Excessive secretions:  glycopyrrolate 0.2mg  IV q 4 hr prn  4.  Prognosis: At this time, there are some signs of stabilization; however, overall this is a terminal condition.      All family's questions answered at this time.      Subjective:     Received one dose of prn lorazepam yesterday.  Family requested that scheduled lorazepam doses be held throughout the night as they believed she appeared to be comfortable.  One episode of urination yesterday.    Family expressing some confusion and distress as she has survived the night and even seems to be having more mumbling vocalizations this morning.  Overall they feel she is more comfortable on the morphine infusion.      Objective:  Temperature: 37.1 C (98.8 F)  Heart Rate: 90  BP (Non-Invasive): (unable to obtain BP RN notified)  Respiratory Rate: (!) 24(RN notified)  SpO2: (O2 not reading)    Constitutional:appears chronically ill and vital signs reviewed  Eyes:Conjunctiva clear., Sclera non-icteric.  ENT: Mouth mucous membranes dry.  Respiratory:decreased breath soundsthroughout all lung fields.  Unlabored.  Cardiovascular:regular rate and rhythm.  ii/vi sem at R sternal border.  Gastrointestinal:Soft, non-tender, Bowel sounds normal, non-distended, No hepatosplenomegaly  Musculoskeletal:Head atraumatic and normocephalic and PROM  Integumentary:Patients hands and feet are cool, now able to palpate pedal pulses, no radial  pulses palpable; mottling of feet  Neurologic:Unresponsive.  Some faint vocalizations, non-purposeful.      Labs:  No labs ordered.      Gloria PeachJanna Baker Rogers, MD

## 2017-10-24 NOTE — Nurses Notes (Signed)
Pt resting. family in attendance. respers 20 per minute. Morphine at 1 mg/hr cont to healthy site. Turned by CAs, no breakdown noted to posterior body. Pillows for positioning. Nonverbal. Facial features relaxed. No evidence of pain. Hands and arms cool to touch. BP and pulse ox will not register onVS machine. Faint thready pulse palpable. motteling to BLE

## 2017-10-24 NOTE — Nurses Notes (Signed)
859: family at bedside. Pt appears to be comfortable. Morphine PCA at 1mg /hr. Ativan scheduled refused by family as they feel that the pt is comfortable. RR 20/m. No signs of distress. Will continue to monitor.    1048: family at bedside. Family requesting something as the pt sounds wet and uncomfortable. Morphine PCA at 1mg /hr. PRN Robinul given. HOB was elevated to greater than 45 degrees to soothe family with their concerns.     1115: PRN Ativan and Morphine 1mg  push administered for pt comfort level. Family at bedside. Upset and concerned with breathing and the "wet" sounds the pt is making. Offered reassurance that the pain medication will take effect. Family requesting me to suction the pt. I explained that suctioning will probably not help the situation but will attempt if it helps them remain calm. Husband and additional family members stepped out with both daughters and son in law remaining at bedside. I then attempted to suction the pt. Unsuccessful with attempted to help with secretions. Reassure the family that medications that part of the process of passing has advanced and the final time could be soon.     1120: paged Dr. Aundria Rudogers to obtain orders for increase of the Morphine PCA and PRN doses. Callback retrieved with increasing Morphine PCA to 3mg /hr.    1127: Nurse preparing to enter the room when a family member came out the door. Requested that I come to bedside that he thinks "something has happened". Upon entering the room, pt skin was pale and daughters were crying holding the pts hands. They said that they think she is gone. When attempting to palpate carotid pulse and auscultate apical pulse- was unable to. Pt had ceased respirations and no pulse was to be found. Dr. Aundria Rudogers and on call Hospice notified.

## 2017-10-24 NOTE — Care Management Notes (Signed)
Referral Information  ++++++ Placed Provider #1 ++++++  Case Manager: Shelly Blaniar  Provider Type: Hospice - Outpatient  Provider Name: Friona Caring  Address:  [address]  [place], [place] [address]  Contact:    Fax:   Fax:

## 2017-10-24 NOTE — Expiration Summary (Signed)
EXPIRATION SUMMARY        PATIENT NAME:  Laureen OchsJester, Joycelynn   MRN:  [id number]  DOB:  [birth year]    ADMISSION DATE:  2017/07/13  EXPIRATION DATE:  09/26/2017      PRIMARY CARE PHYSICIAN: No Pcp     ADMISSION DIAGNOSIS:  Aortic dissection (CMS HCC) [I71.00]    FINAL DIAGNOSIS:  Aortic Dissection     Principle Problem:  Pain and agitation    Active Hospital Problems    Diagnosis Date Noted   . Aortic dissection ([facility]) 10/05/2017      Resolved Hospital Problems   No resolved problems to display.     There are no active non-hospital problems to display for this patient.       Code status prior to expiration:  CMO/DNR    REASON FOR HOSPITALIZATION AND HOSPITAL COURSE:  This is a 72 y.o., female who was found to have an aortic dissection.  Family elected to pursue comfort measures only and she was enrolled in hospice.  [facility] and [facility].  She died on 09/26/2017 at 1127.    cc: Primary Care Physician:  No Pcp  No address on file    IO:[id number]:Referring Physician:  Vicente SereneHarold Roberts, MD  [address]  [address]  [place] [phone]  Glorious PeachJanna Baker Rogers, MD

## 2017-10-24 DEATH — deceased

## 2017-11-15 NOTE — Addendum Note (Signed)
Addendum  created 11/15/17 1225 by Arman FilterSloyer, Gweneth Fredlund Aaron, MD    Child order released for a procedure order, Intraprocedure Blocks edited, Intraprocedure Event edited, Sign clinical note

## 2017-11-15 NOTE — Anesthesia Procedure Notes (Addendum)
TEE Probe    Patient location during procedure: OR  Start time: 10/05/2017 12:24 PM    StaffingAuthorizing Provider: Arman FilterSloyer, Rubbie Goostree Aaron, MD  Indication: diagnostic evaluation and surgery  Adult [phone])    Preanesthetic Checklist  Completed: patient identified and IV checked      Procedure:  Ease of Probe Insertion: easy  Tolerance of Procedure: anesthetized  Complications: no  Additional Notes  [id number]

## 2017-11-21 NOTE — Addendum Note (Signed)
Addendum  created 11/21/17 1126 by Sherren KernsHayanga, Tatjana Turcott, MD    Attestation recorded in Intraprocedure, Intraprocedure Attestations filed

## 2017-11-22 NOTE — Addendum Note (Signed)
Addendum  created 11/22/17 1758 by Arman FilterSloyer, Tyreonna Czaplicki Aaron, MD    Attestation recorded in Intraprocedure, Intraprocedure Attestations filed

## 2017-12-11 NOTE — Addendum Note (Signed)
Addendum  created 12/11/17 1536 by Sherren KernsHayanga, Chasidy Janak, MD    Attestation recorded in Intraprocedure, Intraprocedure Attestations filed

## 2018-01-10 ENCOUNTER — Other Ambulatory Visit: Payer: PPO

## 2018-01-16 NOTE — Addendum Note (Signed)
Addendum  created 01/16/18 0958 by Arman FilterSloyer, Sharaya Boruff Aaron, MD    Intraprocedure Blocks edited, Sign clinical note

## 2018-07-12 IMAGING — CT CT ANGIO CHEST
2 of 7 series · 15 of 46 positions shown · IV contrast (iopamidol)
Comparison: Right shoulder MRI - 09/18/2011

CLINICAL DATA: Dilated aortic root.

EXAM:
CT ANGIOGRAPHY CHEST WITH CONTRAST
TECHNIQUE: Multidetector CT imaging of the chest was performed using the
standard protocol during bolus administration of intravenous
contrast. Multiplanar CT image reconstructions and MIPs were
obtained to evaluate the vascular anatomy.
CONTRAST:  100mL [id number] IOPAMIDOL ([id number]) INJECTION 76%

[Series 4: aorta 3.0 i31f 2 · axial · 0.65mm/px · z∈[-303,-57]mm · 12 of 98 slices shown]
[im 8/98  lung]
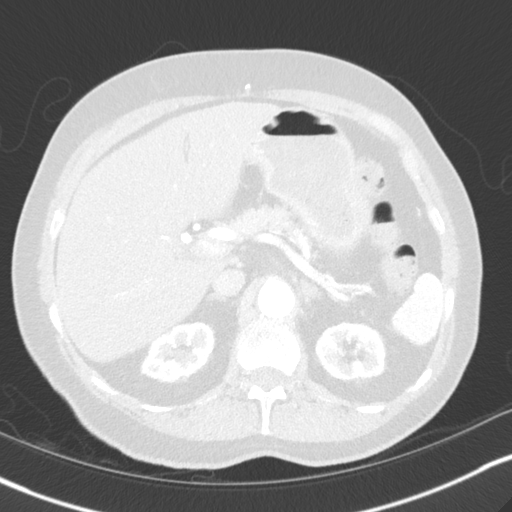
[im 15/98  soft-tissue]
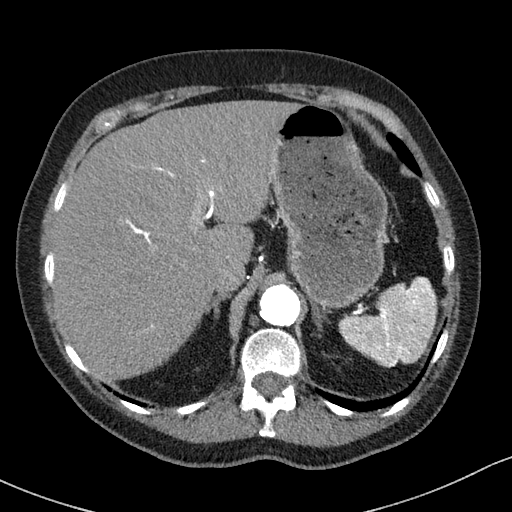
[im 22/98  lung]
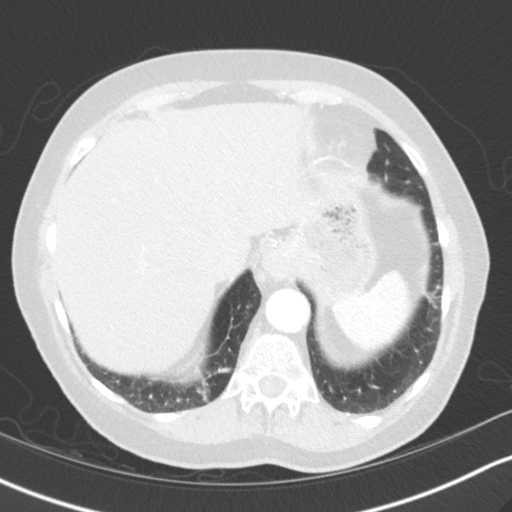
[im 29/98  soft-tissue]
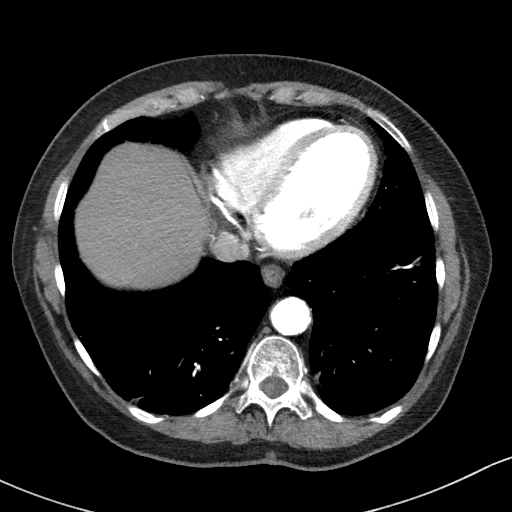
[im 36/98  lung]
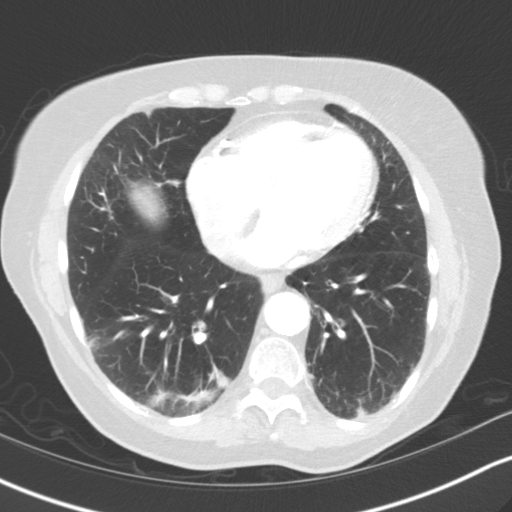
[im 44/98  soft-tissue]
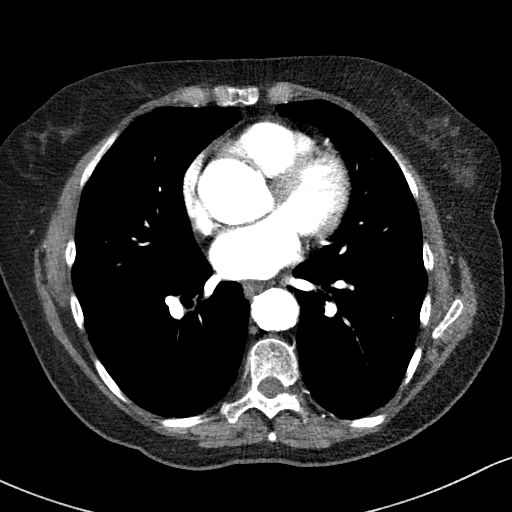
[im 54/98  lung]
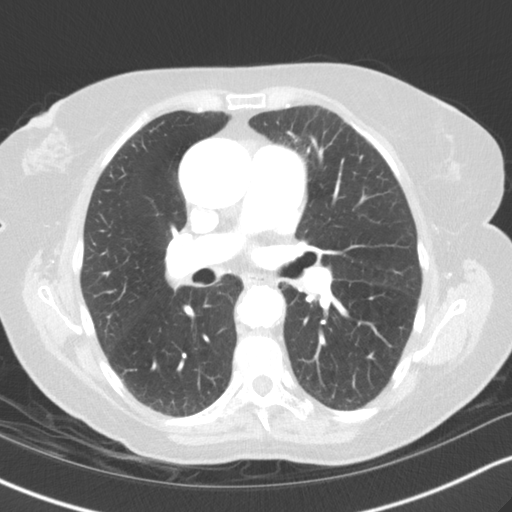
[im 62/98  soft-tissue]
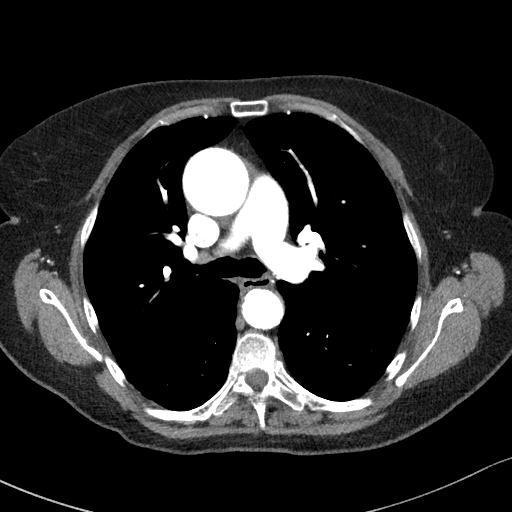
[im 69/98  lung]
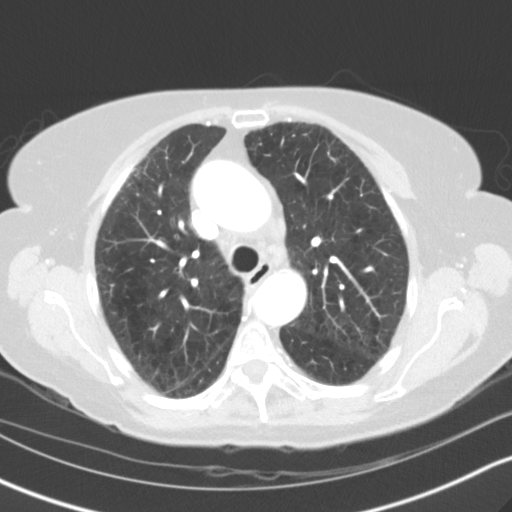
[im 76/98  soft-tissue]
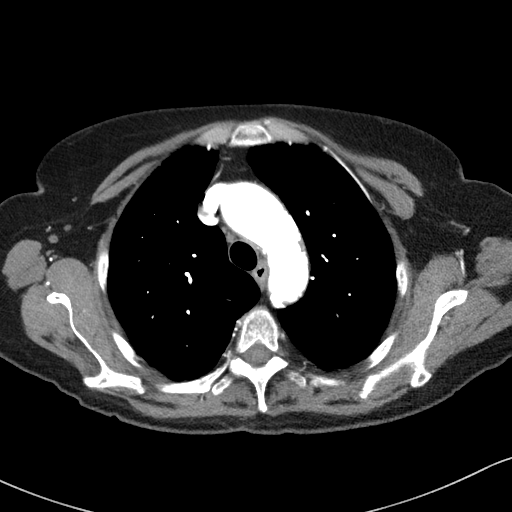
[im 83/98  lung]
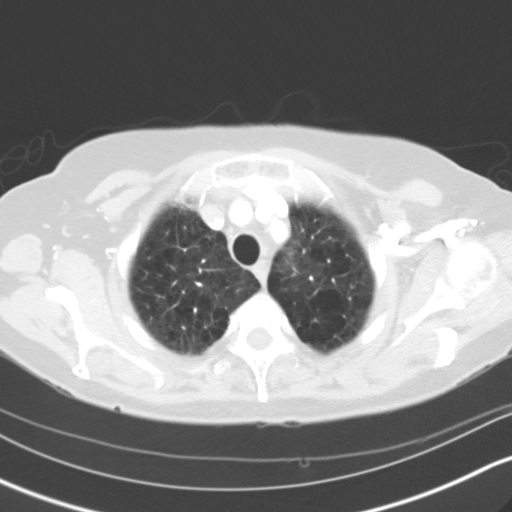
[im 90/98  soft-tissue]
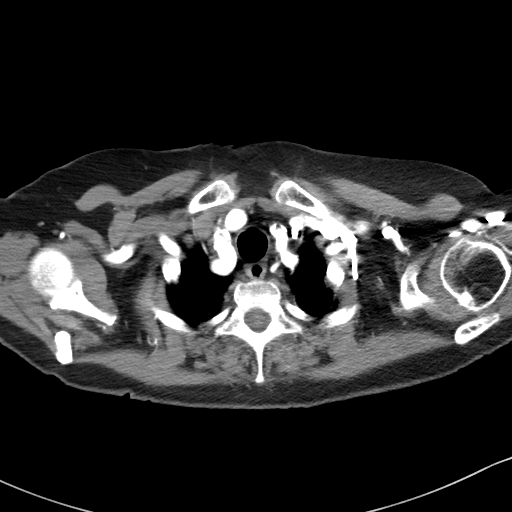

[Series 7: coronals · coronal · 0.59mm/px · 3 of 128 slices shown]
[im 32/128  soft-tissue]
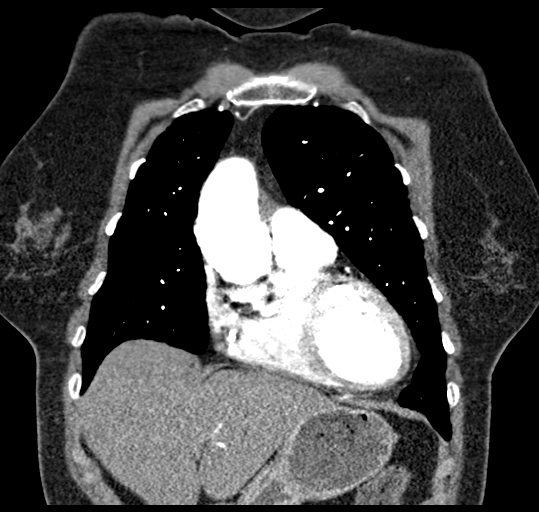
[im 64/128  soft-tissue]
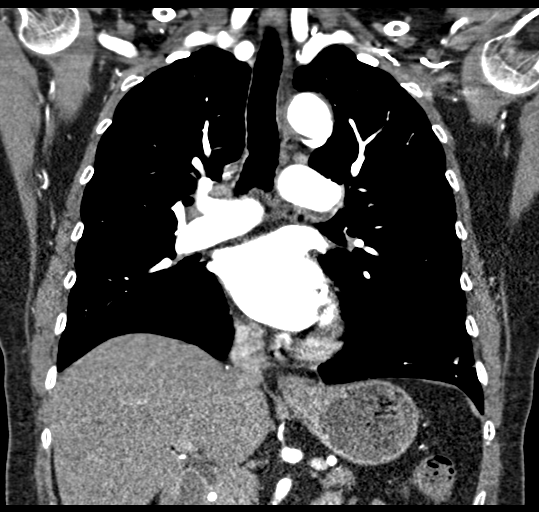
[im 96/128  soft-tissue]
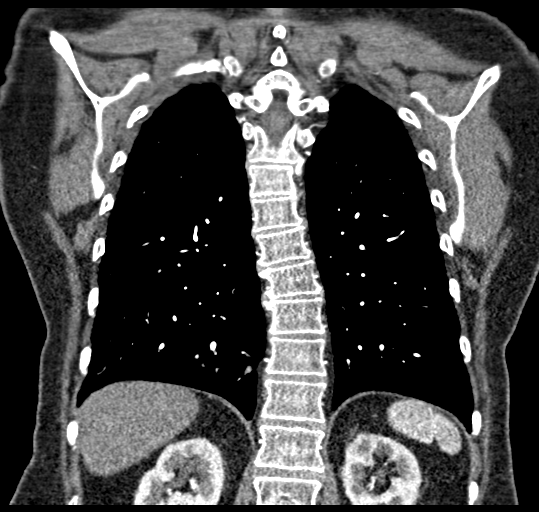

[15 of 46 positions shown; findings below may reference images not displayed]

FINDINGS: Vascular Findings:

There is fusiform aneurysmal dilatation of the thoracic aorta with
measurements as follows.

The thoracic aorta tapers to a normal caliber at the level of the
aortic arch. The descending thoracic aorta is tortuous but of normal
caliber.

There is a large amount of irregular mixed calcified and
noncalcified atherosclerotic plaque throughout the thoracic aorta
(representative images 57 and 98, series 4), not definitely
resulting in a hemodynamically significant stenosis. No definitive
thoracic aortic dissection or periaortic stranding on this nongated
examination.

Conventional configuration of the aortic arch. There is a large
amount of eccentric predominantly calcified atherosclerotic plaque
involving the origin of the left subclavian (image 54, series 7) and
right brachiocephalic (axial image 18, series 4, coronal image 49,
series 9), resulting at least 50% luminal narrowing at both of these
locations. There is a moderate amount of mixed calcified and
noncalcified atherosclerotic plaque involving the origin of the left
common carotid artery, not definitely resulting in a hemodynamically
significant stenosis.

Normal heart size. Coronary artery calcifications. No pericardial
effusion.

Although this examination was not tailored for the evaluation the
pulmonary arteries, there are no discrete filling defects within the
central pulmonary arterial tree to suggest central pulmonary
embolism. Caliber the main pulmonary artery is enlarged measuring
3.4 cm in diameter.

-------------------------------------------------------------

Thoracic aortic measurements:

Sinotubular junction

40 mm as measured in greatest oblique coronal dimension.

Proximal ascending aorta

46 mm as measured in greatest oblique axial dimension at the level
of the main pulmonary artery (image 44, series 4) and approximately
44 mm in greatest oblique coronal diameter (coronal image 38, series
7)

Aortic arch aorta

28 mm as measured in greatest oblique sagittal dimension.

Proximal descending thoracic aorta

28 mm as measured in greatest oblique axial dimension at the level
of the main pulmonary artery.

Distal descending thoracic aorta

28 mm as measured in greatest oblique axial dimension at the level
of the diaphragmatic hiatus.

Review of the MIP images confirms the above findings.

-------------------------------------------------------------

Non-Vascular Findings:

Mediastinum/Lymph Nodes: Scattered mediastinal and hilar lymph nodes
are not enlarged by size criteria with index pretracheal lymph node
measuring 0.7 cm in greatest short axis diameter (image 33, series
4) and index right hilar lymph node measuring 0.6 cm (image 41,
series 4). No bulky mediastinal, hilar axillary lymphadenopathy.

Lungs/Pleura: Moderate to advanced apical predominant prominently
centrilobular emphysematous change.

No made of an approximately 0.7 cm right lower lobe pulmonary nodule
(axial image 72, series 5, coronal image 105, series 7) as well as
an additional punctate (approximately 4 mm) right lower lobe
pulmonary nodule (image 51, series 5).

There is minimal thickening along the right major fissure (image 53,
series 5), favored to represent a perifissural lymph node.

Minimal bibasilar subpleural these opacities (representative images
61 and 68, series 5) appears somewhat nodular on the axial images
though linear on the coronal images and thus favored to represent
areas of atelectasis and/or scarring. No discrete focal airspace
opacities. No pleural effusion or pneumothorax. Central pulmonary
airways appear widely patent.

Upper abdomen: Limited early arterial phase evaluation of the upper
abdomen demonstrates potential mild atrophy of the left kidney in
comparison to the right. Irregular mixed calcified and noncalcified
atherosclerotic plaque within the abdominal aorta.

Musculoskeletal: Peripherally sclerotic subchondral lucencies within
the glenoid are favored to represent subchondral geodes versus
intraosseous ganglion, progressed compared to remote shoulder MRI
performed [DATE]. No definite acute or aggressive osseous
abnormalities. Thyroid appears slightly atrophic. Regional soft
tissues appear normal.
IMPRESSION: 1. Uncomplicated fusiform aneurysmal dilatation of the ascending
thoracic aorta measuring 46 mm in diameter. No evidence of thoracic
aortic dissection or periaortic stranding on this nongated
examination.
2. Suspected hemodynamically significant narrowings involving the
origins of the right brachiocephalic and left subclavian arteries.
Further evaluation with dedicated neck CTA could be performed as
indicated.
3. Enlargement of the caliber the main pulmonary artery as could be
seen in the setting of pulmonary arterial hypertension. Further
evaluation cardiac echo could be performed as indicated.
4. Indeterminate right lower lobe pulmonary nodules, largest of
which measures 7 mm in diameter. Non-contrast chest CT at 3-6 months
is recommended. If the nodules are stable at time of repeat CT, then
future CT at 18-24 months (from today's scan) is considered optional
for low-risk patients, but is recommended for high-risk patients.
This recommendation follows the consensus statement: Guidelines for
Management of Incidental Pulmonary Nodules Detected on CT Images:
5. Aortic Atherosclerosis ([id number].F) and Emphysema ([id number].H).
